# Patient Record
Sex: Male | Born: 1950 | Race: White | Hispanic: No | Marital: Single | Smoking: Current every day smoker
Health system: Southern US, Community
[De-identification: ages and names within clinical notes are randomized; demographics above are authoritative.]

## PROBLEM LIST (undated history)

## (undated) DIAGNOSIS — N189 Chronic kidney disease, unspecified: Secondary | ICD-10-CM

## (undated) DIAGNOSIS — F329 Major depressive disorder, single episode, unspecified: Secondary | ICD-10-CM

## (undated) DIAGNOSIS — E039 Hypothyroidism, unspecified: Secondary | ICD-10-CM

## (undated) DIAGNOSIS — F32A Depression, unspecified: Secondary | ICD-10-CM

## (undated) DIAGNOSIS — I10 Essential (primary) hypertension: Secondary | ICD-10-CM

## (undated) DIAGNOSIS — F419 Anxiety disorder, unspecified: Secondary | ICD-10-CM

## (undated) DIAGNOSIS — E785 Hyperlipidemia, unspecified: Secondary | ICD-10-CM

## (undated) DIAGNOSIS — G4730 Sleep apnea, unspecified: Secondary | ICD-10-CM

## (undated) DIAGNOSIS — E119 Type 2 diabetes mellitus without complications: Secondary | ICD-10-CM

## (undated) HISTORY — PX: APPENDECTOMY: SHX54

## (undated) HISTORY — PX: EYE SURGERY: SHX253

---
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
## 1999-11-14 ENCOUNTER — Emergency Department (HOSPITAL_COMMUNITY): Admission: EM | Admit: 1999-11-14 | Discharge: 1999-11-14 | Payer: Self-pay | Admitting: Emergency Medicine

## 2003-11-23 ENCOUNTER — Emergency Department (HOSPITAL_COMMUNITY): Admission: EM | Admit: 2003-11-23 | Discharge: 2003-11-24 | Payer: Self-pay | Admitting: Emergency Medicine

## 2003-11-24 ENCOUNTER — Ambulatory Visit: Payer: Self-pay | Admitting: Psychiatry

## 2003-11-24 ENCOUNTER — Inpatient Hospital Stay (HOSPITAL_COMMUNITY): Admission: EM | Admit: 2003-11-24 | Discharge: 2003-11-29 | Payer: Self-pay | Admitting: Psychiatry

## 2003-11-27 ENCOUNTER — Ambulatory Visit (HOSPITAL_COMMUNITY): Admission: RE | Admit: 2003-11-27 | Discharge: 2003-11-27 | Payer: Self-pay | Admitting: Internal Medicine

## 2006-11-30 ENCOUNTER — Inpatient Hospital Stay (HOSPITAL_COMMUNITY): Admission: EM | Admit: 2006-11-30 | Discharge: 2006-12-08 | Payer: Self-pay | Admitting: Emergency Medicine

## 2006-12-01 ENCOUNTER — Ambulatory Visit: Payer: Self-pay | Admitting: Psychiatry

## 2006-12-08 ENCOUNTER — Inpatient Hospital Stay (HOSPITAL_COMMUNITY): Admission: AD | Admit: 2006-12-08 | Discharge: 2006-12-14 | Payer: Self-pay | Admitting: *Deleted

## 2006-12-08 ENCOUNTER — Ambulatory Visit: Payer: Self-pay | Admitting: *Deleted

## 2007-01-01 ENCOUNTER — Inpatient Hospital Stay (HOSPITAL_COMMUNITY): Admission: EM | Admit: 2007-01-01 | Discharge: 2007-01-04 | Payer: Self-pay | Admitting: Emergency Medicine

## 2008-06-24 ENCOUNTER — Emergency Department (HOSPITAL_COMMUNITY): Admission: EM | Admit: 2008-06-24 | Discharge: 2008-06-25 | Payer: Self-pay | Admitting: Emergency Medicine

## 2008-06-24 ENCOUNTER — Emergency Department (HOSPITAL_COMMUNITY): Admission: EM | Admit: 2008-06-24 | Discharge: 2008-06-24 | Payer: Self-pay | Admitting: Emergency Medicine

## 2008-12-08 IMAGING — CR DG ABDOMEN ACUTE W/ 1V CHEST
3 series · 3 of 3 positions shown · non-contrast
Comparison: none

CLINICAL DATA: Chest pain.  
 ACUTE ABDOMINAL SERIES ? 3 VIEW:

[w chest pa]
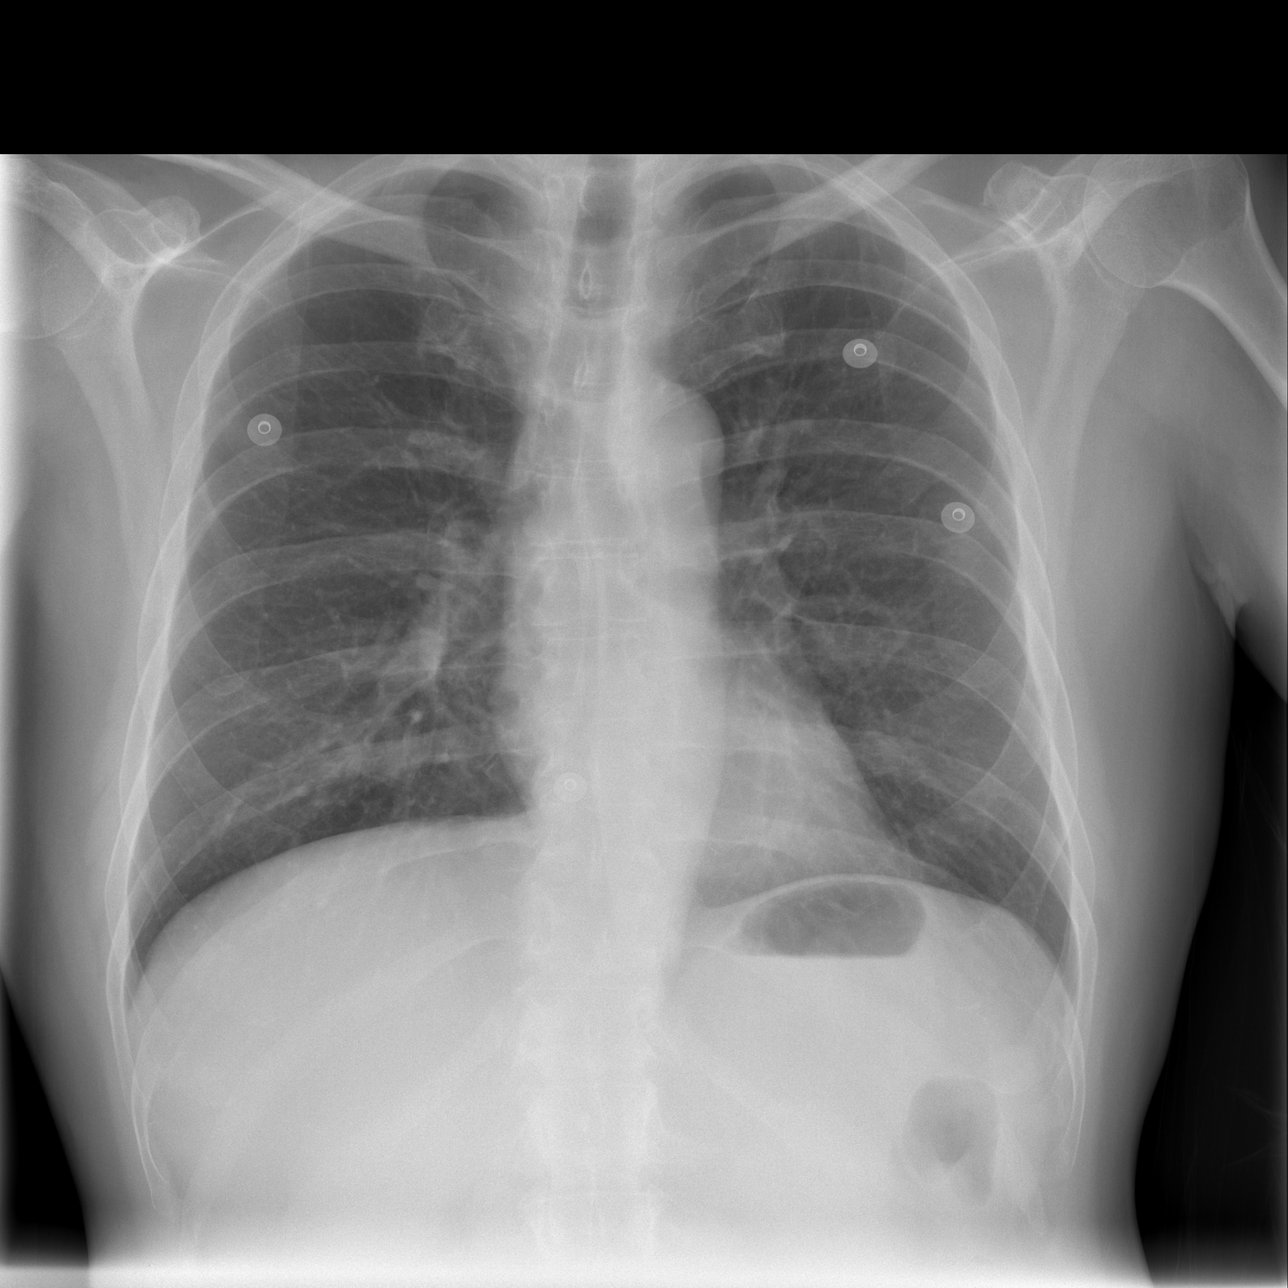

[w abdomen upright *]
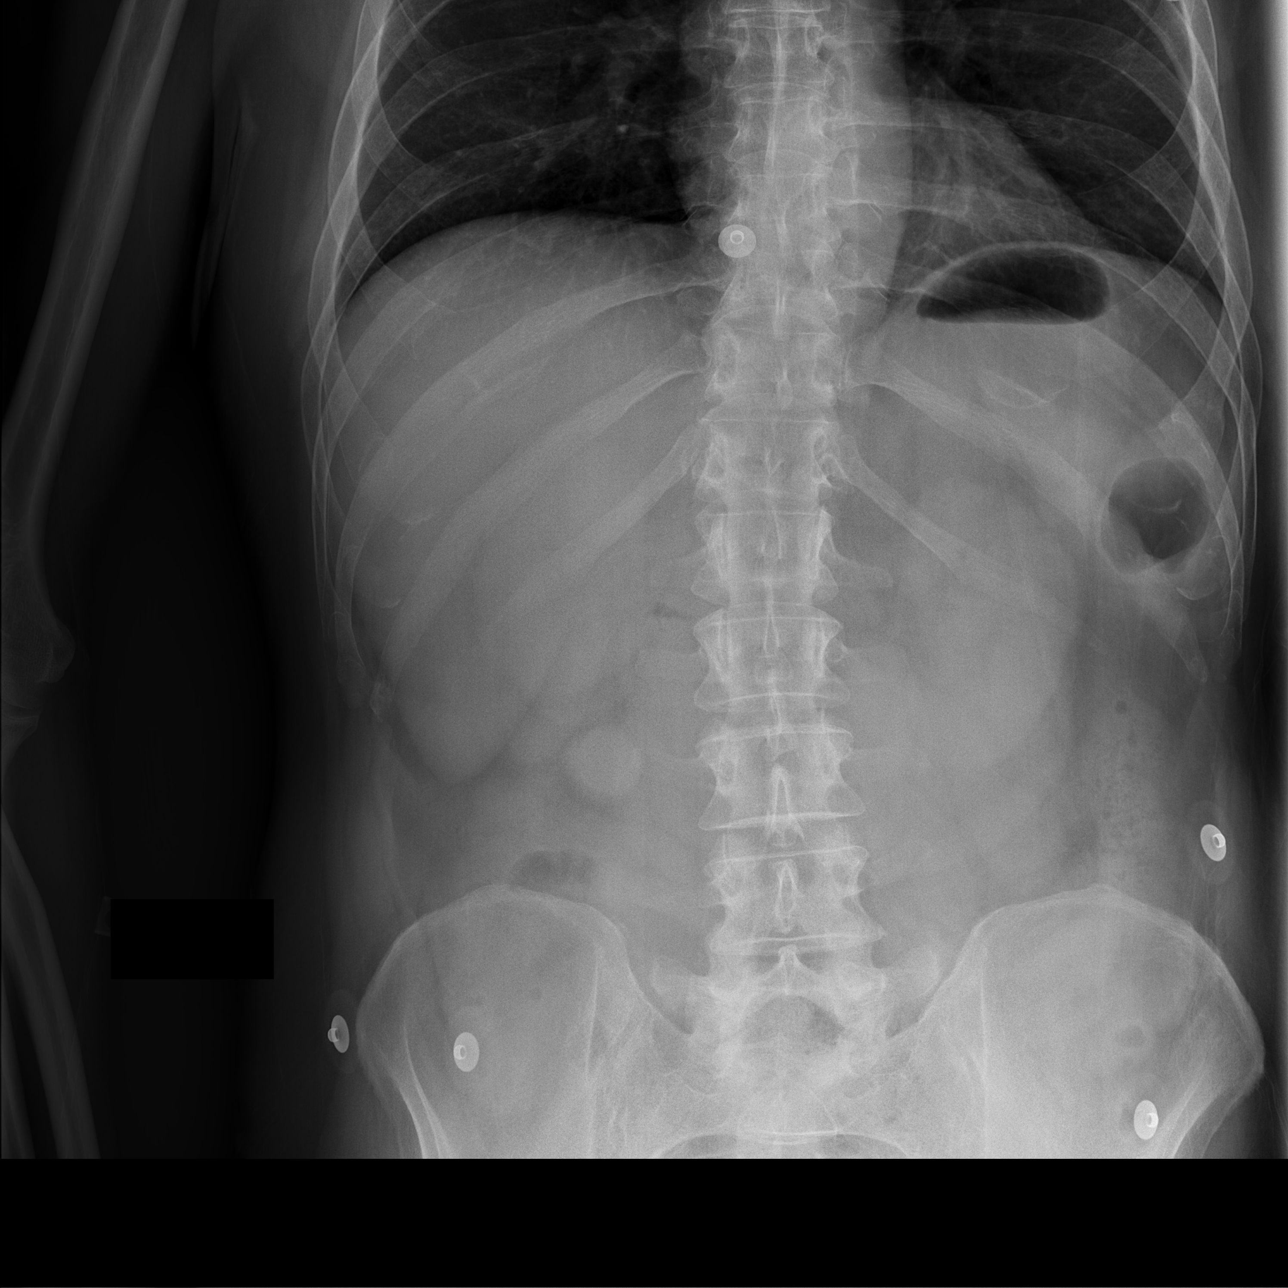

[t abdomen supine]
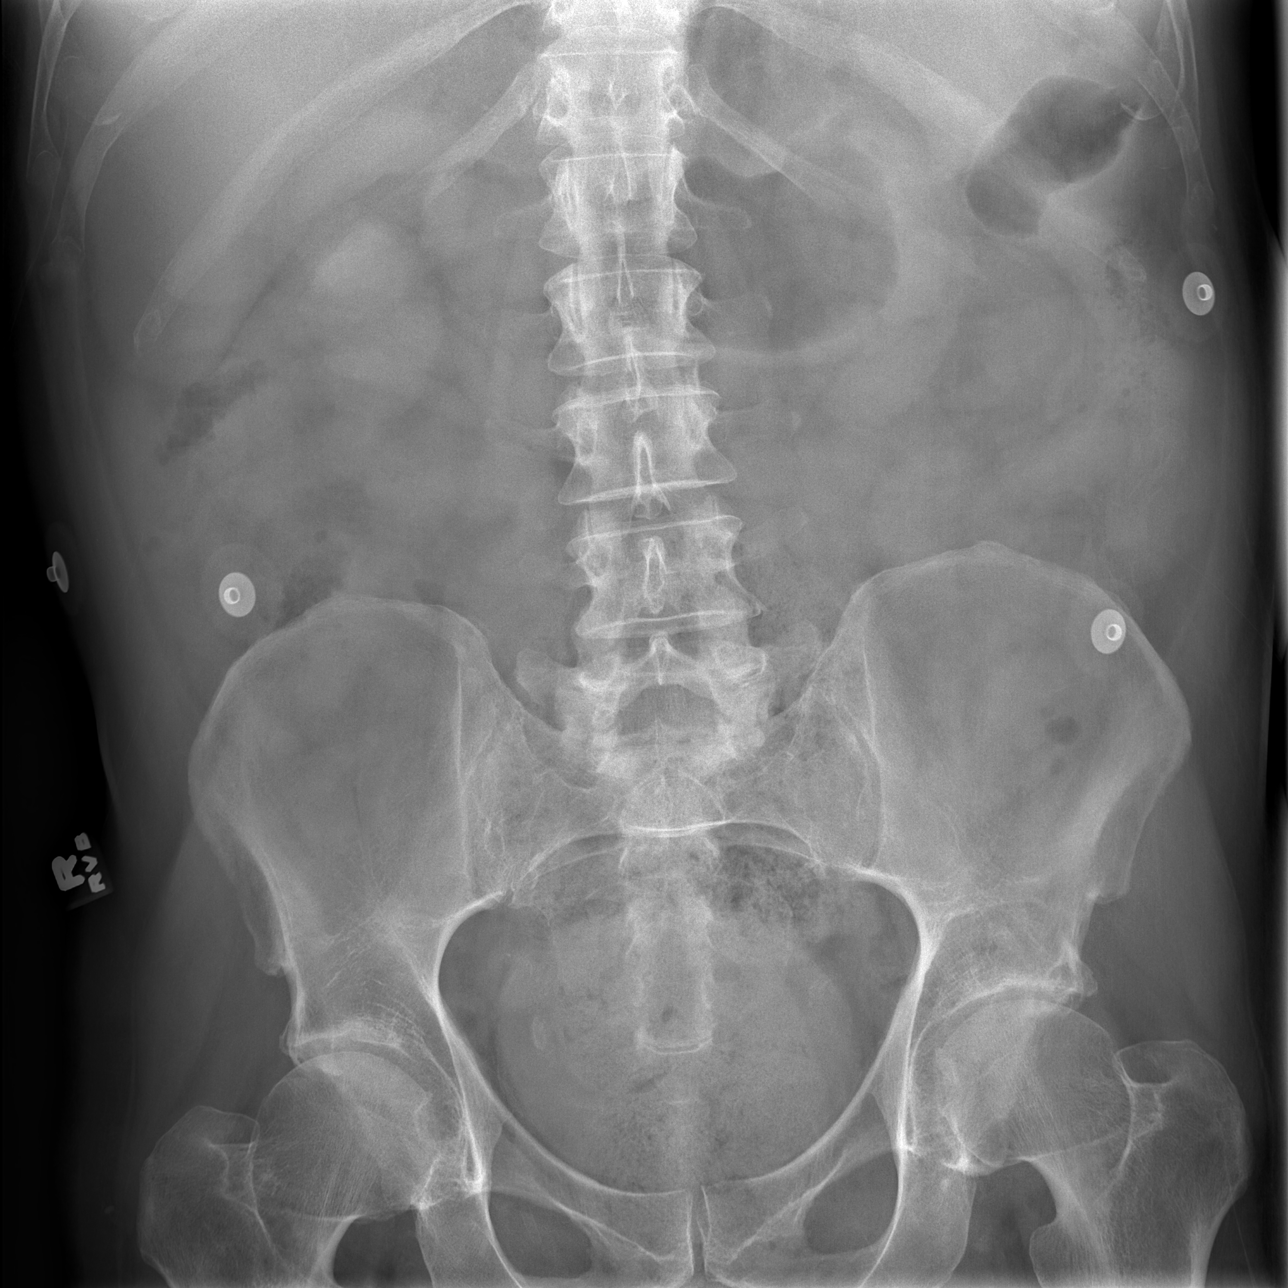

[3 of 3 positions shown; findings below may reference images not displayed]

FINDINGS: Cardiomediastinal silhouette is unremarkable.  The lungs are clear.  The bowel gas pattern is unremarkable.  There is no evidence of bowel obstruction or pneumoperitoneum.  Mild degenerative changes within the lower lumbar spine and hips are present.  There is no evidence of pneumoperitoneum.
IMPRESSION: No acute abnormality.

## 2010-02-26 ENCOUNTER — Encounter: Payer: Self-pay | Admitting: Internal Medicine

## 2010-05-17 LAB — GLUCOSE, CAPILLARY
Glucose-Capillary: 111 mg/dL — ABNORMAL HIGH (ref 70–99)
Glucose-Capillary: 122 mg/dL — ABNORMAL HIGH (ref 70–99)
Glucose-Capillary: 135 mg/dL — ABNORMAL HIGH (ref 70–99)
Glucose-Capillary: 176 mg/dL — ABNORMAL HIGH (ref 70–99)
Glucose-Capillary: 278 mg/dL — ABNORMAL HIGH (ref 70–99)
Glucose-Capillary: 411 mg/dL — ABNORMAL HIGH (ref 70–99)
Glucose-Capillary: 507 mg/dL (ref 70–99)
Glucose-Capillary: 55 mg/dL — ABNORMAL LOW (ref 70–99)

## 2010-05-17 LAB — URINALYSIS, ROUTINE W REFLEX MICROSCOPIC
Bilirubin Urine: NEGATIVE
Glucose, UA: 250 mg/dL — AB
Ketones, ur: NEGATIVE mg/dL
Leukocytes, UA: NEGATIVE
Nitrite: NEGATIVE
Protein, ur: 100 mg/dL — AB
Specific Gravity, Urine: 1.022 (ref 1.005–1.030)
Urobilinogen, UA: 0.2 mg/dL (ref 0.0–1.0)
pH: 5 (ref 5.0–8.0)

## 2010-05-17 LAB — COMPREHENSIVE METABOLIC PANEL
ALT: 17 U/L (ref 0–53)
AST: 21 U/L (ref 0–37)
Albumin: 3 g/dL — ABNORMAL LOW (ref 3.5–5.2)
Alkaline Phosphatase: 66 U/L (ref 39–117)
BUN: 31 mg/dL — ABNORMAL HIGH (ref 6–23)
CO2: 25 mEq/L (ref 19–32)
Calcium: 8.9 mg/dL (ref 8.4–10.5)
Chloride: 107 mEq/L (ref 96–112)
Creatinine, Ser: 1.36 mg/dL (ref 0.4–1.5)
GFR calc Af Amer: >60 mL/min (ref >60)
GFR calc non Af Amer: 54 mL/min — ABNORMAL LOW (ref >60)
Glucose, Bld: 89 mg/dL (ref 70–99)
Potassium: 3.3 mEq/L — ABNORMAL LOW (ref 3.5–5.1)
Sodium: 139 mEq/L (ref 135–145)
Total Bilirubin: 0.3 mg/dL (ref 0.3–1.2)
Total Protein: 6.3 g/dL (ref 6.0–8.3)

## 2010-05-17 LAB — DIFFERENTIAL
Basophils Absolute: 0 10*3/uL (ref 0.0–0.1)
Basophils Relative: 0 % (ref 0–1)
Eosinophils Absolute: 0.1 10*3/uL (ref 0.0–0.7)
Eosinophils Relative: 1 % (ref 0–5)
Lymphocytes Relative: 9 % — ABNORMAL LOW (ref 12–46)
Lymphs Abs: 1.2 10*3/uL (ref 0.7–4.0)
Monocytes Absolute: 0.6 10*3/uL (ref 0.1–1.0)
Monocytes Relative: 4 % (ref 3–12)
Neutro Abs: 11.7 10*3/uL — ABNORMAL HIGH (ref 1.7–7.7)
Neutrophils Relative %: 85 % — ABNORMAL HIGH (ref 43–77)

## 2010-05-17 LAB — BASIC METABOLIC PANEL
BUN: 39 mg/dL — ABNORMAL HIGH (ref 6–23)
CO2: 23 mEq/L (ref 19–32)
Calcium: 10.1 mg/dL (ref 8.4–10.5)
Chloride: 101 mEq/L (ref 96–112)
Creatinine, Ser: 1.45 mg/dL (ref 0.4–1.5)
GFR calc Af Amer: >60 mL/min (ref >60)
GFR calc non Af Amer: 50 mL/min — ABNORMAL LOW (ref >60)
Glucose, Bld: 444 mg/dL — ABNORMAL HIGH (ref 70–99)
Potassium: 5 mEq/L (ref 3.5–5.1)
Sodium: 136 mEq/L (ref 135–145)

## 2010-05-17 LAB — CBC
HCT: 38.4 % — ABNORMAL LOW (ref 39.0–52.0)
Hemoglobin: 13.1 g/dL (ref 13.0–17.0)
MCHC: 34.2 g/dL (ref 30.0–36.0)
MCV: 88.1 fL (ref 78.0–100.0)
Platelets: 262 10*3/uL (ref 150–400)
RBC: 4.35 MIL/uL (ref 4.22–5.81)
RDW: 13.8 % (ref 11.5–15.5)
WBC: 13.7 10*3/uL — ABNORMAL HIGH (ref 4.0–10.5)

## 2010-05-17 LAB — URINE MICROSCOPIC-ADD ON

## 2010-06-10 ENCOUNTER — Other Ambulatory Visit: Payer: Self-pay | Admitting: Family Medicine

## 2010-06-10 DIAGNOSIS — S92909A Unspecified fracture of unspecified foot, initial encounter for closed fracture: Secondary | ICD-10-CM

## 2010-06-21 NOTE — Consult Note (Signed)
NAME:  Johnny [name], Johnny [name] NO.:  [id number]   MEDICAL RECORD NO.:  [id number]          PATIENT TYPE:  EMS   LOCATION:  [facility]                         FACILITY:  [facility]   PHYSICIAN:  Blima Rich, MD      DATE OF BIRTH:  12-Sep-1950   DATE OF CONSULTATION:  06/25/2008  DATE OF DISCHARGE:  06/24/2008                                 CONSULTATION   REASON FOR CONSULTATION:  Hypoglycemia.   HISTORY OF PRESENT ILLNESS:  Johnny [name] is a pleasant 60 year old  Caucasian male who had presented to the ER earlier this morning with  complaints of hypoglycemia and claiming that he had stopped taking all  his medications for over approximately one month, secondary to him not  wanting to live anymore.  They evaluated him in the ER and found that  his blood sugar was approximately 444.  They gave him 10 units of  regular insulin, fed him and sent the patient home.   At home, the patient's blood sugar was 197.  He claims that he has been  prescribed 50 units of regular insulin to take before meals, if his  blood sugar is in that range.  He took 50 units and then became  hypoglycemic and lethargic, and was brought to the emergency department.   At the time of initial presentation, his blood sugar was 60 and blood  pressure 80/60.  The patient is adamant and he denies that this was not  a suicide attempt.  The patient received D50 as well as was fed in the  ER.  Now his blood sugars are  persistently in the 120-130s over the  past 3 hours.  He is alert, oriented and stable, and able to converse  appropriately with me.  He does complain of a mild headache at this  time.  No blurring of vision.  No tinnitus.  No syncope.  No presyncope.  No lightheadedness.  He did have nausea and vomiting earlier when his  blood sugar was low.  Currently no complaints.  No abdominal pain,  diarrhea or constipation; dysuria, polyuria or hematuria.  No chest  pain, palpitations, PND, orthopnea; and no  shortness of breath, cough or  expectoration.   PAST MEDICAL/SURGICAL HISTORY:  1. Hypertension.  2. Diabetes.  3. Depression.   SOCIAL HISTORY:  Occasional alcohol drinker.  Smokes a pipe.  Denies  illicits.   FAMILY HISTORY:  Mother is alive, has mild dementia and hypertension.  Father __________; patient cannot recall.   ALLERGIES:  The patient claims that he has no known drug allergies.   HOME MEDICATIONS:  1. Aspirin 81 mg p.o. daily.  2. Zocor 40 mg p.o. q.h.s.  3. Hydrochlorothiazide 25 mg daily.  4. Lisinopril 20 mg daily.  5. Effexor SR 225 mg at bedtime.  6. [place] __________.  7. Vitamin D one tablet daily.   REVIEW OF SYSTEMS:  This actually 14-point review of systems has been  performed.  Pertinent positives and negatives as described above.   PHYSICAL EXAMINATION:  VITALS:  (At time of presentation)  Temperature  97.9, heart  rate 90, respiratory rate 15, blood pressure 117/76, O2  saturations 99% on room air.  GENERAL:  A well nourished, well developed Caucasian male, lying in bed  comfortably.  No apparent distress; conversing with his family members.  HEENT:  Normocephalic and atraumatic.  Moist oral mucosa.  No thrush,  erythema or postnasal drip.  Eyes anicteric.  Extraocular muscles were  intact.  Pupils are equal and reactive to light and accommodation.  CARDIOVASCULAR:  S1 and S2 normal.  Regular rate and rhythm.  No  murmurs, rubs or gallops.  RS:  Air entry is bilaterally equal.  No rales, rhonchi or wheezes  appreciated.  ABDOMEN:  Soft, nontender, nondistended.  Positive bowel sounds.  No  organomegaly.  EXTREMITIES:  No cyanosis, clubbing or edema.  Positive bilateral  dorsalis pedis.  CNS:  Alert, oriented x3.  Cranial nerves II-XII grossly intact.  Power,  sensation and reflexes bilaterally symmetrical.  SKIN:  No breakdown, swelling, ulcerations or masses.  HEMATOLOGY/ONCOLOGY:  No palpable lymphadenopathy, ecchymosis, bruising  or  petechia.  NECK:  Supple, good range of motion.  No thyromegaly, no carotid bruit.  Neck veins appear to be within normal limits.   LABORATORY TESTS:  CMP:  Sodium 139, potassium 2.3, chloride 107, CO2  25, glucose 89, BUN 31, creatinine 1.36.  CBC:  White blood cell count  13.7, hemoglobin 13.1, hematocrit 38.4, platelet count 262, polymorphs  85.   DIAGNOSTIC TESTS:  Chest one view demonstrates no active ongoing  disease.  No cardiomegaly.  No cardiopulmonary disease.   ASSESSMENT AND PLAN:  Hypoglycemia:  Currently resolved.  This is  secondary to administration of high dose of regular insulin (50 units  with a blood sugar of 197).  Of note, apparently the patient received  only 10 units of regular insulin for his blood sugar of 444 in the  emergency department earlier in the day.  I have had an extensive  discussion, both with the patient and his 2 sisters who are at his  bedside.  I have asked the patient repeatedly what his home doses of  insulin are.  He claims that he has it written down at home on his  insulin card at home, but he cannot recall it off the top of his head.   Based on his current presentation, I have recommended that he take  approximately 10 units of Lantus at bedtime, and not take more than 12  units of regular insulin before a heavy meal.  If he is to have a light  or normal meal, to only take 6 units.  I have expressed this with both  the patient as well as his sisters who are at the bedside.  I have also  instructed all of them that he is to call his primary MD in the morning,  and to obtain his home doses of Lantus and regular insulin.  The patient  has expressed understanding as well as have the sisters.  They claim  that they will follow up as instructed.   Also of note, I have asked [facility] to see the patient in the  emergency department, and make an assessment prior to the patient being  discharged.  I have spoken to them personally and  they will see the  patient while he is in the emergency department.   He is clear from a medical standpoint and I feel that he can be  discharged to  home, if [facility] does not feel  any other  therapies or interventions are warranted at this time.      Blima Rich, MD  Electronically Signed     SP/MEDQ  D:  06/25/2008  T:  06/25/2008  Job:  [id number]

## 2010-06-21 NOTE — Consult Note (Signed)
NAMERANDOM, TOUSANT NO.:  [id number]   MEDICAL RECORD NO.:  [id number]          PATIENT TYPE:  INP   LOCATION:  [place]                         FACILITY:  [facility]   PHYSICIAN:  Felizardo Hoffmann, M.D.  DATE OF BIRTH:  08-03-1950   DATE OF CONSULTATION:  11/30/2006  DATE OF DISCHARGE:                                 CONSULTATION   REQUESTING PHYSICIAN:  [facility].   REASON FOR CONSULTATION:  Depression, suicide attempt, of alcoholism.   HISTORY OF PRESENT ILLNESS:  [name] is a 60 year old male  currently in the [facility] emergency room awaiting admission to the  [facility].   [name] overdosed on medication last night.  He acknowledges that he  had been having suicidal thoughts.  He has depressed mood, hopelessness,  low energy, difficulty concentrating.  He is not having any  hallucinations or delusions.  He is a partial historian.   PAST PSYCHIATRIC HISTORY:  [name] was admitted to the [facility] in 2005 for major depression with suicidal  ideation.  He was discharged on Lexapro 15 mg q.a.m.   Most recently, the patient is taking Effexor 75 mg b.i.d.   FAMILY PSYCHIATRIC HISTORY:  None known.   SOCIAL HISTORY:  The patient has been living with his mother.  He has a  supportive sister who lives in the area.  He has a history of drinking  alcohol.  He does not use any illegal drugs.  He used to be a Dietitian.   PAST MEDICAL HISTORY:  Diabetes with glucose on admission above 600,  rule out delirium.   MEDICATIONS:  MAR is reviewed.  The patient is on the following  psychotropic medications:  1. Xanax 0.5 mg b.i.d.  2. Effexor 75 mg b.i.d.  3. Ativan 2 mg q.6h. p.r.n.  4. Ambien 5 mg q.h.s. p.r.n..   LABORATORY DATA:  SGOT is 15, SGPT is 20.  WBC 6.5, hemoglobin 15.3,  platelet count 251.   REVIEW OF SYSTEMS:  Noncontributory.   PHYSICAL EXAMINATION:  [name] is a middle-aged male lying  in a  supine position on his emergency room gurney.  He is alert.  His eye  contact is intermittent.  He has constricted affect.  His mood is  depressed.  On orientation testing, he knows the year and the month as  well as the day of the month.  He does know place.  Memory testing:  2/3  immediate, 2/3 at 5 minutes.  Fund of knowledge and intelligence grossly  within normal limits.  Speech is without dysarthria.  There is a flat  prosody and a poverty of speech.  Thought process:  There is slight  difficulty with word finding.  Overall coherent thought content.  He  does have thoughts of hopelessness and helplessness as well as suicidal  ideation.  He does not consider his life worth living.  His  concentration is decreased.  Judgment is impaired.  Insight is partial.   On hand extension, he has no tremor.  He has no  sweats.  Please see the  emergency room report for vital signs.   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   History of alcohol abuse versus dependence.   Rule out 293.00, delirium, not otherwise specified, which appears to be  improving.   AXIS II:  Deferred.   AXIS III:  See general medical section.   AXIS IV:  Primary support group, general medical.   AXIS V:  30.   [name] is still at risk to harm himself.  He also has impaired  judgment.   RECOMMENDATIONS:  1. Would continue a sitter while he is in the general medical      environment.  2. Once medically cleared, would admit to a psychiatric hospital for      dual diagnosis evaluation and treatment.  3. Would continue to monitor for alcohol withdrawal.  It is unclear      how much alcohol he has been using.  His transaminases do not show      any indication of acute alcohol insult.  Would have Ativan      available and start the Ativan withdrawal protocol if alcohol      withdrawal presents.  4. Would continue his thiamine 100 mg daily, multivitamin daily,  folic      acid 1 mg daily.  5. 12-step method if the patient regains his cognitive capability.  6. Would continue his Effexor 75 mg b.i.d.      Felizardo Hoffmann, M.D.  Electronically Signed     JW/MEDQ  D:  11/30/2006  T:  12/02/2006  Job:  [id number]

## 2010-06-21 NOTE — H&P (Signed)
Johnny [name], SIRCY NO.:  [id number]   MEDICAL RECORD NO.:  [id number]          PATIENT TYPE:  INP   LOCATION:  1232                         FACILITY:  [facility]   PHYSICIAN:  Jana Hakim, M.D. DATE OF BIRTH:  [birth year]   DATE OF ADMISSION:  12/31/2006  DATE OF DISCHARGE:                              HISTORY & PHYSICAL   PRIMARY CARE PHYSICIAN:  Unassigned.   CHIEF COMPLAINT:  Overdose.   HISTORY OF PRESENT ILLNESS:  This is a 60 year old male who was sent to  the emergency department emergently after being found confused and  taking an increased amount of over-the-counter Benadryl.  The patient's  sister called emergency medical services and had the patient transported  to the hospital.  The patient is unable to give the history and his  sister at bedside reports he had a similar hospitalization on October  24, was admitted, and then was transferred to [facility]  once he was stabilized.  The patient has a history of severe depression.   PAST MEDICAL HISTORY:  1. Type 2 diabetes.  2. Hyperlipidemia.  3. Hypertension.   MEDICATIONS:  His the patient's sister reports that patient has  medications, however does not take them regularly.  He has:  1. Glipizide 10 mg b.i.d.  2. Simvastatin 40 mg one p.o. q.h.s.   ALLERGIES:  NO KNOWN DRUG ALLERGIES.   SOCIAL HISTORY:  Lives with his mother.  He is disabled, unemployed.  Positive history of tobacco, smokes four cigarettes per day.  Unknown  history of alcohol.   FAMILY HISTORY:  Noncontributory.  However, the patient does have a  family history of depression and bipolar disorder.   REVIEW OF SYSTEMS:  The patient has had no nausea, vomiting, diarrhea,  chest pain, shortness of breath.  His blood sugars have been elevated.  In the emergency department, his blood sugars were found to be in the  500s.   PHYSICAL EXAMINATION:  GENERAL:  This is a 60 year old thin, well-  developed male in  discomfort but no acute distress.  VITAL SIGNS:  On initial evaluation, temperature 96.4, blood pressure  192/114, heart rate 108, respirations 20, O2 saturations 100%.  HEENT: Normocephalic, atraumatic.  Pupils are sluggish but reactive.  Extraocular muscles are intact.  Funduscopic benign.  Oropharynx is  clear.  Mucosa dry.  No exudates.  NECK: Supple full range of motion.  No thyromegaly, adenopathy, jugular  venous distention.  CARDIOVASCULAR:  Tachycardiac rate and rhythm.  No murmurs, gallops or  rubs.  LUNGS: Clear to auscultation bilaterally.  ABDOMEN:  Positive bowel sounds, soft, nontender, nondistended.  EXTREMITIES: Without cyanosis, clubbing or edema.  NEUROLOGIC:  The patient is confused.  His speech is slurred.  Cranial  nerves are intact.  There is no facial drooping or tongue deviation.  Motor and sensory function are intact.  He is able to move all four of  his extremities.   LABORATORY STUDIES:  White blood cell count 7.9, hemoglobin 14.8,  hematocrit 42.8, MCV 88.1, platelets 350, neutrophils 70% lymphocytes  22%, glucose level 468.   PLAN:  The patient will be admitted to an ICU area for cardiac and  pulmonary monitoring.  An IV insulin drip has been ordered per the  insulin protocol.  IV fluids have also been ordered.  The patient will  be placed on clear liquids for now.  He will be monitored for cardiac  changes and cardiac enzymes will be performed.  The urine drug screen is  pending as well as the Tylenol level.  Pending results of these, further  treatments may be rendered.  A one-to-one sitter has been ordered along  with suicide precautions, and DVT and GI prophylaxis and antiemetics  have been ordered.  Psychiatry will be consulted.      Jana Hakim, M.D.  Electronically Signed     HJ/MEDQ  D:  01/01/2007  T:  01/02/2007  Job:  [id number]

## 2010-06-21 NOTE — Discharge Summary (Signed)
NAMEDAKSH, STOCKLEY NO.:  [id number]   MEDICAL RECORD NO.:  [id number]          PATIENT TYPE:  INP   LOCATION:  1503                         FACILITY:  [facility]   PHYSICIAN:  Aquilla Hacker, M.D. DATE OF BIRTH:  1950/12/05   DATE OF ADMISSION:  11/30/2006  DATE OF DISCHARGE:  12/08/2006                               DISCHARGE SUMMARY   FINAL DIAGNOSES:  1. Alcohol dependence.  2. Major depressive disorder, recurrent and severe.  3. Mild transaminitis.  4. Diabetes mellitus with uncontrolled sugars.  5. Acute renal failure.  6. Hyperlipidemia.   CONSULTATIONS:  Psychiatry, Felizardo Hoffmann, M.D.   PROCEDURES:  1. Acute abdominal x-ray completed November 30, 2006.  2. Renal ultrasound completed December 01, 2006.   PRIMARY CARE PHYSICIAN:  Unassigned.   HISTORY OF PRESENT ILLNESS:  Mr. Everhart is a 60 year old gentleman who  was brought to the hospital by the family.  They indicated that the  patient had not been eating or taking his medications for at least a  month.  He had taken an unknown number of sleeping pills in an attempt  to commit suicide.  At the time of his admission, he complained of some  nausea, some burning chest discomfort.   PAST MEDICAL HISTORY:  Please see that dictated by Dr. Marilynne Drivers.   HOSPITAL COURSE:  #1.  MAJOR DEPRESSION WITH SUICIDE ATTEMPT:  Dr. Rhona Raider,  psychiatrist, was consulted.  A one-to-one sitter was provided.  The  patient was started on Effexor, and the dose has been titrated  throughout the patient's hospital stay.  Psychiatry has continued to  follow the patient.  The ultimate goal is for the patient to be  transferred to a psychiatric facility for further aggressive treatment.   #2.  ALCOHOL DEPENDENCE:  The patient was started on thiamine,  multivitamins, and folic acid.  He did not display any documented signs  of withdrawal during the course of his hospitalization.   #3.  ACUTE RENAL  INSUFFICIENCY:  At the time of the patient's admission,  his BUN was noted to have been 24 with a creatinine of 1.78.  Whether or  not this was attributed to dehydration is questionable.  However, over  the course of the patient's hospitalization, his BUN improved to 11 with  a creatinine of 1.11 by December 06, 2006.   #4.  ELEVATED LIVER ENZYMES OR MILD HEPATITIS:  The patient's SGOT was  noted to be 15, SGPT 20, alkaline phosphatase 66 on November 30, 2006.  By October 26, the patient's alkaline phosphatase decreased to 47, SGOT  to 171, SGPT to 133. The etiology of this is questionable.  There was  concern that the patient's statin  he had been on versus alcohol may  have contributed to these numbers.  The patient did not complain of any  abdominal pain throughout the course of his hospitalization.  Hepatitis  B surface antigen was found to be negative, and hepatitis C antibody was  found to be negative.  Liver enzymes have been monitored over the course  of hospitalization, and they have  normalized as of October 30 with a  bilirubin of 0.6, alkaline phosphatase 46, SGOT 33, and SGPT of 65.  The  patient's Zocor has subsequently been discontinued.   #5.  UNCONTROLLED DIABETES MELLITUS:  At the time of admission, the  patient's serum glucose was found to be 644.  He had an acetone of 92  with the glucose being greater than 1000 in his urine.  This was most  likely secondary to the patient's noncompliance with his medications  prior to this admission.  The patient was started on a regimen of Lantus  insulin as well as glipizide.  His sugars have improved, however, they  are not completely normal.  A hemoglobin A1c was completed on October  24.  It revealed a hemoglobin A1c of 15.8, indicating that the patient  more than likely has been noncompliant with his diabetic medications for  at least 3 months if not longer.  Again, the patient's sugars are  better; however, they are not ideally  controlled.  I suspect that once  the patient is discharged from the hospital, other adjustments to his  medications can take place.   #6.  HYPERLIPIDEMIA:  A fasting lipid profile was completed October 24.  The cholesterol level was 246, triglycerides 164, with LDL of 175.  Again, the patient was on a statin  previously; however, this was  discontinued due to the elevated liver enzymes.  I suspect that in the  near future, the patient's primary care physician may consider  restarting him on medications for treatment of his hyperlipidemia.   CONDITION ON DISCHARGE:  Currently, the patient has been without any  distress over the past 3-4 days.  He looks comfortable and has been  cooperative.  His vital signs today show temperature 98.4, heart rate  78, respirations 20, blood pressure 132/83.  O2 saturation 97% on room  air.  Decision has been made to discharge the patient from the hospital  to [facility].   DISCHARGE MEDICATIONS:  1. Xanax 0.5 mg p.o. b.i.d.  2. Norvasc 10 mg p.o. daily.  3. Aspirin 81 mg p.o. daily.  4. Folic acid 1 mg p.o. daily.  5. Glipizide 10 mg p.o. b.i.d.  6. Lantus insulin 31 units nightly.  7. Multivitamins 1 tablet p.o. daily.  8. Nicotine patch 40 mg q. 14 h.  9. Protonix 40 mg daily.  10.Senokot S 1 tablet p.o. daily.  11.Thiamine 100 mg p.o. daily.  12.Effexor 225 mg p.o. daily.  13.Albuterol MDI 2 puffs q. 6 h p.r.n.  14.Ambien 5 mg p.o. nightly p.r.n.      Aquilla Hacker, M.D.  Electronically Signed     OR/MEDQ  D:  12/08/2006  T:  12/08/2006  Job:  [id number]

## 2010-06-21 NOTE — H&P (Signed)
NAME:  Johnny [name], Johnny [name] NO.:  [id number]   MEDICAL RECORD NO.:  [id number]          PATIENT TYPE:  EMS   LOCATION:  ED                           FACILITY[facility]  [facility]   PHYSICIAN:  Durwin Nora, MDDATE OF BIRTH:  [birth year]   DATE OF ADMISSION:  11/30/2006  DATE OF DISCHARGE:                              HISTORY & PHYSICAL   PRESENTING COMPLAINT:  Elevated blood sugar, poor oral intake for 1  month, suicidal ideation, several attempts x2 days.   HISTORY OF PRESENTING COMPLAINT:  This is a 60 year old male who was  brought to the emergency room today by family as he had not been eating  or taking his medication deliberately for the last 1 month.  He admits  to having consumed an unknown number of sleeping pills that he bought  over-the-counter yesterday in a suicide attempt.  Patient also gives a  history of nausea, retrosternal burning chest pain, no palpitations, no  shortness of breath. He has bouts of vomiting, no diarrhea, no  constipation.  No jaundice.  He has no appetite.  He has insomnia.  He  has anhedonia. He denies any fever, headaches.  The last time patient  saw a psychiatrist was in the spring, 2008 and he has not been taking  his medication for depression.  He thinks it possibly was Effexor but is  not sure.   PAST MEDICAL HISTORY:  1. Hypertension.  2. Diabetes.  3. Hyperlipidemia.  4. Depression.   FAMILY HISTORY:  Two aunts have psychotic disease, bipolar disorder and  schizophrenia.   PAST SURGICAL HISTORY:  Appendectomy.   SOCIAL HISTORY:  The patient smokes a pipe, about an ounce of tobacco  every day.  He drinks about 4 bottles of beer daily. He denies use of  illicit drugs. Patient is divorced, has no children.   REVIEW OF SYSTEMS:  Systems reviewed, pertinent for as above.   PHYSICAL EXAMINATION:  GENERAL:  He is a middle-aged man not in acute  respiratory distress.  He has a flat affect.  HEENT:  Head is atraumatic,  normocephalic, pupils are equal, round and  reactive to light and accommodation. Extraocular movements are intact.  Mucous membranes are moist.  NECK:  Supple.  CHEST:  Is clinically clear.  CARDIAC:  Heart sounds 1 and 2 regular.  ABDOMEN:  Soft, mildly distended, nontender, no organomegaly.  EXTREMITIES:  Peripheral pulses are present, no pedal edema.  VITAL SIGNS:  Temperature is 98, pulse is 88, respiratory rate is 18,  blood pressure 154/94, saturating 96% on room air.   MEDICATIONS:  1. Glipizide 10 mg b.i.d.  2. Cialis 20 mg p.r.n.  3. Cymbalta 40 mg daily.   ALLERGIES:  No known drug allergies.   LAB STUDIES:  WBC 6.5 thousand, hematocrit 44, platelet count is  252,000.  Sodium is 135, potassium 5.1, chloride 97, bicarbonate 27,  glucose 644, BUN 24, creatinine 1.78.  AST is 15, ALT is 20, alkaline  phosphatase is 66, total bilirubin 1.4, lipase is 28, acetone is 92,  total protein less than 0.05, CK-MB less than  1.  Urinalysis:  glucose  is greater than 1000.   EKG shows normal sinus rhythm at 90 per minute.   ASSESSMENT:  1. Uncontrolled diabetes.  2. Major depression, suicide attempt, suicidal ideation.  3. Hyperlipidemia.  4. Renal insufficiency.  5. Chest pain, rule out secondary to gastroesophageal reflux disease.  6. Gastroparesis.   The patient is being admitted to a telemetry bed, started on IV fluid  normal saline 75 mL/Hr. Glucomander protocol after which he will be  switched to Lantus Insulin 50 units SQ q.h.s. and NovoLog 4 units t.i.d.  with meals, continue Glipizide 10 mg daily, start him of Effexor 75 mg  b.i.d., get a psychiatric consult.  Suicide watch.  Transfer to be a [place]  inpatient once medically stable.  Diet to be a 1800 calorie, low  cholesterol, ADA diet.  Will follow his calcium and phosphate, thyroid function tests, lipids,  cardiac enzymes and EKG.  Lovenox 30 mg SQ daily. Phenergan 12.5 mg IV  q.8 hours p.r.n. and O2 2 liters p.r.n.   Sliding scale insulin per  protocol.  Start amlodipine 10 mg daily, lopressor 2.5 mg IV q.6 hours  p.r.n. for blood pressure greater than 164/110.  The patient has no  health care [place] and is a full code for now.      Durwin Nora, MD  Electronically Signed     MIO/MEDQ  D:  11/30/2006  T:  12/01/2006  Job:  [id number]

## 2010-06-21 NOTE — Discharge Summary (Signed)
NAMEDEMBA, HULLENDER NO.:  [id number]   MEDICAL RECORD NO.:  [id number]          PATIENT TYPE:  INP   LOCATION:  [id number]                         FACILITY:  [facility]   PHYSICIAN:  Durwin Nora, MDDATE OF BIRTH:  1950-11-23   DATE OF ADMISSION:  12/31/2006  DATE OF DISCHARGE:  01/04/2007                               DISCHARGE SUMMARY   DISCHARGE DIAGNOSES:  1. Intentional drug overdose, suicide attempt.  2. Diabetes.  3. Hypertension, fair control.  4. Urinary tract infection, improved.  5. Hypokalemia, resolved.  6. Protein calorie malnutrition.  7. Hyperlipidemia.  8. History of previous suicide attempt.  9. Major depression.   CONSULTATIONS:  Dr. Felizardo Hoffmann, psychiatry.   HOSPITAL COURSE:  This 60 year old patient now presented to the  emergency room after taking unknown amounts of Benadryl and Tylenol PM.  Has past medical history of severe depression, had previously been  admitted on 11/30/2006 for suicide attempt that was treated at the  behavior center.  She was admitted to the intensive care unit, started  on IV insulin drip, placed on suicide watch with 1 on 1 sitter.  He had  severe hypokalemia which was repleted.  His urinalysis did show he has a  urinary tract infection and urine culture was sent, result is still  pending.  He was started on nitrofurantoin.  Please note that the urine  culture did come back positive for staph coagulase sensitive to Levaquin  and nitrofurantoin.  He was started on nitrofurantoin on 01/03/2007.  At  present, mood is stabilizing.  He was initially voluntarily committed on  admission but now the patient is voluntarily wanting to seek inpatient  psychiatric help as suggested by the psychiatrists.  Possibilities are  history of alcohol abuse and acute renal insufficiency which is  resolved.  See diagnoses. In addition, the patient was tachycardic on  presentation but this is all resolved.  Glucose on admission  was 401.   DISCHARGE CONDITION:  Stable.   DIET:  1800 calorie ADA, heart healthy, low cholesterol.   ACTIVITY:  Increase as tolerated.   FOLLOW UP:  Followup with the psychiatrist at [facility].  He is  also to be attended from the primary care physician at the [facility] for his medical problems.   MEDICATIONS ON DISCHARGE:  1. Protonix 40 mg p.o. daily.  2. Insulin 20 units subcutaneous every h.s.  3. Nitrofurantoin 100 mg b.i.d. for 3 more days.  4. Norvasc 10 mg daily.  5. Librium 25 mg b.i.d.  6. Effexor 75 mg t.i.d.  7. Reminyl 15 mg every h.s.  8. Phenergan 12.5 mg p.o. every 8 hours as needed for nausea.  9. Clonidine 0.12 mg every 8 hours p.r.n. for blood pressure greater      than 160/110.   PHYSICAL EXAMINATION:  GENERAL:  no acute distress.  VITAL SIGNS:  Temperature 97, pulse 66, respiratory rate 18, blood  pressure 119/68.  HEENT:  Pupils equal, round and reactive to light and accommodation.  NECK:  Supple. Mucous membranes are moist.  Oropharynx and nasopharynx  is clear.  HEAD:  Atraumatic, normocephalic.  No elevated JVD or submandibular  leaks.  No thyromegaly.  No carotid bruits.  CHEST:  Clinically clear.  HEART:  Sounds 1 and 2 regular rate and rhythm.  ABDOMEN:  Benign, soft, nontender, no organomegaly.  Bowel sounds normal  active.  NEUROLOGIC:  Cranial nerves are patent.  He is alert, oriented x 3.  No  suicidal or homicidal ideation.  Memory intact.  He has insight.  EXTREMITIES:  Peripheral pulses present.  No pedal edema.   RECOMMENDATIONS:  1. Increase his Lantus insulin to 18 units subcu every h.s.  He is to      be on Accu-check a.c., h.s. with moderate sliding scale coverage.      The Lantus insulin is to be increased to 16 units subcu every h.s.,      __________ 16 units subcu every h.s.  previous medication.   LABORATORY:  Glucose 148 ranging to 230.  The urine culture staph  coagulase chemistry today sodium 141,  potassium 2.6, chloride 115,  bicarbonate 23, BUN 7, creatinine 1.3.  hemoglobin A1c 12.3.      Durwin Nora, MD  Electronically Signed     MIO/MEDQ  D:  01/04/2007  T:  01/04/2007  Job:  [id number]

## 2010-06-21 NOTE — H&P (Signed)
Johnny [name], Johnny [name] NO.:  [id number]   MEDICAL RECORD NO.:  [id number]          PATIENT TYPE:  IPS   LOCATION:  0601                          FACILITY:  [facility]   PHYSICIAN:  Stark Jock, M.D. DATE OF BIRTH:  1950-08-31   DATE OF ADMISSION:  12/08/2006  DATE OF DISCHARGE:                       PSYCHIATRIC ADMISSION ASSESSMENT   This is a voluntary admission to the services of Dr. Randye Lobo.   IDENTIFYING INFORMATION:  This is a 60 year old divorced white male.  Apparently, he has become despondent.  He has been unemployed for at  least a year.  He had recently become deliberately not taking his  medications and on October 24th he just could not resist suicidal  ideation anymore and he went out and bought over-the-counter Tylenol PM  and overdosed.  He was admitted to the main hospital 10/24 to 11/01.  He  was treated for his major depression with intentional overdose on  Tylenol PM.  He was treated for his alcohol dependence for acute renal  insufficiency.  His elevated liver enzymes or mild hepatitis and  uncontrolled diabetes mellitus and hyperlipidemia.  At the time of  discharge yesterday, it was noted that he had been without any distress  over the past 3 to 4 days.  He had been cooperative.  He appeared  comfortable and it was decided to discharge him to the [facility].   PAST PSYCHIATRIC HISTORY:  The patient was previously with [place] around  this time in 2005.  He again, at that time, was treated for depression  with suicidal ideation.   SOCIAL HISTORY:  He graduated in 1977 with a BA.  He has been married  once and divorced.  He has no children.  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  He is currently  living with his mother and his mother and sister are financially  supporting him.  He has applied for disability and has been declined at  least once.   FAMILY HISTORY:  He has a younger  sister who has been treated for  depression.   ALCOHOL AND DRUG HISTORY:  He states that he has been drinking 48 ounces  of beer nightly but he can give it up.   PRIMARY CARE [facility]:  Dr. Orpah Melter.   MEDICAL PROBLEMS:  1. Diabetes.  2. Hyperlipidemia.   MEDICATIONS AT THE TIME OF TRANSFER FROM THE MAIN HOSPITAL:  1. He is currently prescribed Xanax 0.5 mg p.o. b.i.d.  2. Norvasc 10 mg p.o. daily.  3. Aspirin 81 mg p.o. daily.  4. Folic acid 1 mg p.o. daily.  5. Glipizide 10 mg p.o. b.i.d.  6. Lantus 31 units subcu at h.s.  7. Multivitamins 1 tablet p.o. daily.  8. Protonix 40 mg p.o. daily.  9. Senokot 1 tablet p.o. daily.  10.Thiamine 100 mg p.o. daily.  11.Effexor 225 mg p.o. daily.  12.Albuterol MDI 2 puffs p.r.n. every q.8 h.   POSITIVE PHYSICAL FINDINGS:  His physical exam is well documented and on  the chart.  He currently is not reporting any positives on his review of  systems.  His vital signs show he is 67 inches tall.  He weighs 150.  Temperature is 98.4.  Blood pressure is 113/73 to 105/57.  Pulse is 104  to 105 and respirations are 18.   MENTAL STATUS EXAM:  Today, he is alert and oriented.  He is  appropriately groomed, dressed, and nourished.  He has on his own  clothes from home.  His speech is brief.  He states he does not like to  speak about these things.  His mood is depressed as is his affect.  His  thought processes are clear, rational, and goal oriented.  He asks that  we arrange for followup through the [facility] in [place] where he has previously been treated.  This is to help  specifically with his medication costs.  Judgment and insight appear to  be intact.  Concentration and memory are intact.  He still reports  occasional suicidal ideation.  He is not having any homicidal ideation  and he has no auditory or visual hallucinations.   AXIS I DIAGNOSES:  1. Major depressive disorder, recurrent, severe, with serious  suicide      attempt by overdose.  2. Alcohol dependence, in early remission.  He states that he can just      stop drinking.   AXIS II:  Deferred.   AXIS III:  1. Diabetes mellitus.  2. Hyperlipidemia.   AXIS IV:  Severe.  He is unemployed and he reports that back in 1977 he  was given a misdemeanor for crimes against nature and this prevents him  from being able to obtain employment.   PLAN:  Admit for safety and stabilization.  We will adjust his meds as  indicated and we will have the case manager work on collaboration with  the [facility] to get his outpatient care established.   ESTIMATED LENGTH OF STAY:  Two to 3 days.      Mickie Kerry Dory, P.A.-C.      Stark Jock, M.D.  Electronically Signed    MD/MEDQ  D:  12/09/2006  T:  12/10/2006  Job:  [id number]

## 2010-06-21 NOTE — Consult Note (Signed)
NAMETAURIN, GUILFOYLE NO.:  [id number]   MEDICAL RECORD NO.:  [id number]          PATIENT TYPE:  INP   LOCATION:  [place]                         FACILITY:  [facility]   PHYSICIAN:  Felizardo Hoffmann, M.D.  DATE OF BIRTH:  Aug 11, 1950   DATE OF CONSULTATION:  12/04/2006  DATE OF DISCHARGE:                                 CONSULTATION   Johnny [name] continues with depressed mood for energy, difficulty,  concentration, anhedonia, thoughts of hopelessness and helplessness.  His orientation has returned.  His memory ability has returned.  He is  not having any hallucinations or delusions.   He does acknowledge drinking at least four beers a day.   Johnny [name] denies any history of elevated mood, high energy, racing  thoughts, decreased need for sleep.   LABORATORY DATA:  SGOT slightly elevated at 44.  SGPT is elevated at 93.  The SGPT was higher yesterday at 129.   PHYSICAL EXAMINATION:  VITAL SIGNS:  Temperature 99, pulse 91,  respiratory rate 20, blood pressure 130/54.  O2 saturation on room air  98%.  MENTAL STATUS:  Johnny [name] does acknowledge suicidal intent.  He has no  thoughts of harming others.  He has no hallucinations or delusions.  He  is oriented to all spheres.  He is alert.  Attention span is slightly  decreased.  Eye contact is good.  Affect is constricted.  Mood is  depressed.  Memory is intact except for the delirium.  His speech is  soft.  There is normal prosody.  There is no dysarthria.  Thought  process is logical, coherent, goal-directed.  No looseness of  associations.  Judgment is intact.   ASSESSMENT:   AXIS I:  1. Alcohol dependence.  2. Major depressive disorder, recurrent, severe.   RECOMMENDATIONS:  1. Would continue the sitter.  2. Would admit to a psychiatric unit for further evaluation and      treatment when medically clear.  3. Would increase the patient's Effexor to 187.5 mg XR daily and      continue to increase it by 37.5 mg every  other day while monitoring      blood pressure for adverse effect.  4. Thiamine 100 mg daily.  5. Multivitamin daily.  6. Folic acid 1 mg daily.      Felizardo Hoffmann, M.D.  Electronically Signed     JW/MEDQ  D:  12/04/2006  T:  12/04/2006  Job:  [id number]

## 2010-06-21 NOTE — Consult Note (Signed)
NAMEAUBRY, RIEHM NO.:  [id number]   MEDICAL RECORD NO.:  [id number]          PATIENT TYPE:  INP   LOCATION:  [place]                         FACILITY:  [facility]   PHYSICIAN:  Felizardo Hoffmann, M.D.  DATE OF BIRTH:  08/06/1950   DATE OF CONSULTATION:  01/01/2007  DATE OF DISCHARGE:  01/04/2007                                 CONSULTATION   REQUESTING PHYSICIAN:  [facility].   REASON FOR CONSULTATION:  Overdose.   HISTORY OF PRESENT ILLNESS:  Mr. Algis Yambao is a 60 year old male  admitted to the [facility] on November 24 after a drug  overdose.  The patient overdosed on Tylenol and Benadryl.  He has been  placed on a one-to-one suicide watch.   The patient has depressed mood, poor concentration.  He acknowledges  suicidal intent.  He does have a history of noncompliance with  medications.   He is currently on:  1. Effexor 75 mg b.i.d.  2. He is on a dose of Librium 25 mg b.i.d.  3. Desyrel 50 mg every night.   WBC 7.9, hemoglobin 14.8, platelet count 350.  SGOT 16, SGPT 17.  Urine  drug screen was negative.  Alcohol negative.  Tylenol negative.  Please  see the attending's general medical assessment.   MENTAL STATUS EXAM:  The patient is alert.  Thought process within  normal limits.  Oriented to all spheres.  Thought content:  Acknowledges  suicidal intent.  Judgment is intact for the need of treatment.   ASSESSMENT:  AXIS I:  293.83, mood disorder not otherwise specified,  depressed.   RECOMMENDATIONS:  Transfer to the [facility] psychiatric unit.  The  patient will continue on the Effexor 75 mg t.i.d.  He is currently on  Librium 25 mg b.i.d. as a withdrawal precaution.      Felizardo Hoffmann, M.D.  Electronically Signed     JW/MEDQ  D:  06/18/2007  T:  06/18/2007  Job:  [id number]

## 2010-06-21 NOTE — Discharge Summary (Signed)
Johnny [name], RIESEN NO.:  [id number]   MEDICAL RECORD NO.:  [id number]          PATIENT TYPE:  IPS   LOCATION:  0601                          FACILITY:  [facility]   PHYSICIAN:  Stark Jock, M.D. DATE OF BIRTH:  [birth year]   DATE OF ADMISSION:  12/08/2006  DATE OF DISCHARGE:  12/14/2006                               DISCHARGE SUMMARY   IDENTIFICATION:  This is a 60 year old divorced white male who was  admitted on a voluntary basis on December 08, 2006.   HISTORY OF PRESENT ILLNESS:  Apparently, the patient became despondent.  He has been unemployed for the past year.  He had recently become  deliberately not taking his medications and on November 30, 2006, he  states he could not resist suicidal ideation anymore.  He went out and  bought over-the-counter Tylenol p.m. and overdosed.  He was admitted to  the [facility] from November 30, 2006 to December 08, 2006.  He was  treated for his major depression with intentional overdose on Tylenol  PM.  He will was treated for his alcohol dependence for acute renal  insufficiency.  His elevated liver enzymes, mild hepatitis, uncontrolled  diabetes mellitus, and hyperlipidemia were also noted.  At the time of  discharge from the main hospital, it was noted that he had been without  any of his stress over the past 3-4 days.  He had been cooperative.  He  appeared comfortable and it was decided to discharge him to [facility].  The patient was previously with [place] around this time in  2005.  He again at that time was treated for depression and suicidal  ideation.  The patient has a younger sister who is treated for  depression.  The patient states he has been drinking 48 ounces of beer  nightly, but can give it up.  The patient has diabetes and  hyperlipidemia.  He is on multiple medications.  See hospital course.   PHYSICAL EXAMINATION:  Physical exam is well documented on the chart.  There were no acute  physical or medical problems noted.  Admission  laboratories were done in the hospital prior to transfer here.  They  were evaluated by the physician there.   HOSPITAL COURSE:  Upon admission, the patient was started on Xanax 0.5  mg p.o. b.i.d., Norvasc 10 mg daily, aspirin 81 mg daily, folic acid 1  mg daily,  Glipizide 10 mg p.o. b.i.d., Lantus 31 units subcutaneous  h.s., multivitamin 1 tablet daily, Protonix 40 mg daily, Senokot 1  tablet daily, thiamine 100 mg daily, Effexor XR 225 mg daily, albuterol  MDI 2 puffs q. 6h. p.r.n.  He was also started on Ambien 5 mg p.o.  q.h.s. p.r.n. insomnia.  On December 11, 2006, the Effexor was changed  from a.m. to h.s. due to his daytime sedation.  He was also placed on a  sliding scale insulin order as per our glycemic control protocol.  He  was able to tolerate his medications well with no significant side  effects.  He was friendly and cooperative  in sessions with me.  He was  able to participate appropriately in unit therapeutic groups and  activities.  He stated he was not working now.  He had been a small  truck driver.  He admits the use 48 ounces of beer nightly.  He does not  feel he needs assistance to stopping alcohol.  He was placed on Effexor  XR 75 mg b.i.d. and Xanax 0.5 mg b.i.d.  The Effexor was just increased  to 225 mg daily and therefore was not increased any further after  admission here.  He still had occasional suicidal ideation.  He has  financial stressors and is out of work.  He has legal problems that date  back in the past and he states it has made it hard for him to get work.  He continued to feel tired, depressed, and anxious.  Appetite began to  improve.  Sleep; there is still some difficulty with waking up in the  middle of the night.  As hospitalization progressed, he became less  depressed and less anxious.  He was still having occasional suicidal  ideation at times it comes and goes, but contracts for safety in  the  hospital.  His suicidal ideation resolved by December 12, 2006.  On  December 13, 2006, he was sleeping well.  Appetite was fair.  Mood was  less depressed and anxious.  There was no suicidal ideation.  He  discussed his financial stressors and stated his sister is probably  going to help him.  He also needs a job, he stated, and has not worked  for over a year.  On December 14, 2006, mental status had improved  markedly from admission status.  The patient was friendly and  cooperative with good eye contact.  Speech was normal rate and flow.  Psychomotor activity was within normal limits.  Mood, euthymic.  Affect,  wide range.  There was no suicidal or homicidal ideation.  No thoughts  of self-injurious behavior.  No auditory or visual hallucinations.  No  paranoia or delusions.  Thoughts were logical and goal-directed.  Thought content, no predominant theme.  Cognitive was grossly back to  baseline.  The patient was felt to be safe to be discharged today.  His  sister was coming to pick him.   DISCHARGE DIAGNOSES:  AXIS I:  Major depressive disorder, recurrent,  severe without psychosis.  Rule out alcohol dependence.  AXIS II:  None.  AXIS III:  Diabetes mellitus and hyperlipidemia.  AXIS IV:  Severe (unemployed and reports that 1977 he was given a  misdemeanor, which prevents him from being able to obtain employment,  burden of psychiatric illness, and burden of medical illness).  AXIS V:  Global assessment of functioning was 50 upon discharge.  Global  assessment of functioning was 35 upon admission.  Global assessment of  functioning highest past year was 70-75.   DISCHARGE PLAN:  There were no specific activity levels or dietary  restrictions.   POSTHOSPITAL CARE PLAN:  The patient will see Dr. Darylene Price at the [facility] on December 20, 2006, at 8:30 a.m..   DISCHARGE MEDICATIONS:  Lantus insulin 31 units at bedtime, Protonix 40  mg daily, Xanax 0.5 mg twice daily,  Norvasc 10 mg daily, aspirin 81 mg  daily, folic acid 1 mg daily, Glucotrol 10 mg twice daily with food,  multivitamin 1 pill daily, thiamine 100 mg daily, Senokot tablet 1 pill  daily, Effexor XR 225 mg daily, Ambien  5 mg at bedtime p.r.n. insomnia,  and albuterol inhaler 2 puffs every 6 hours as needed for shortness of  breath.      Stark Jock, M.D.  Electronically Signed     BHS/MEDQ  D:  12/14/2006  T:  12/15/2006  Job:  [id number]

## 2010-06-24 NOTE — H&P (Signed)
NAME:  Johnny [name], Johnny [name] NO.:  [id number]   MEDICAL RECORD NO.:  [id number]          PATIENT TYPE:  IPS   LOCATION:  0304                          FACILITY:  [facility]   PHYSICIAN:  Rulon Eisenmenger, M.D. DATE OF BIRTH:  [birth year]   DATE OF ADMISSION:  11/24/2003  DATE OF DISCHARGE:                         PSYCHIATRIC ADMISSION ASSESSMENT   IDENTIFYING INFORMATION:  This is a 60 year old single white male,  voluntarily admitted on November 24, 2003.   HISTORY OF PRESENT ILLNESS:  The patient presents with a history of passive  suicidal thoughts.  The patient stopped taking his medications for  approximately a week, hoping to do away with self.  He states his mother  found out about what he was doing and advised him to seek help.  The patient  states that he has thought about doing this before as well.  He states that  his stressors are his medical problems, his financial issues, emotional  problems.  He is separated from his wife.  His sleep has been satisfactory.  The patient did go to the emergency department initially for some complaints  of abdominal pain where he also found out at that time that he had a kidney  stone and upon further questioning the patient was expressing thoughts of  suicidal ideation which prompted this admission to [facility].  The patient also had a past history of passive suicidal thinking,  approximately 5 weeks ago.  The patient also reports a history of taking  caffeine tablets to keep himself awake, has been taking 10-12 a day for the  past 3 years.   PAST PSYCHIATRIC HISTORY:  First admission to [facility], no  current outpatient treatment, no prior suicide attempt.   SOCIAL HISTORY:  A 60 year old single white male, separated for 5 years,  married for 22 years.  He has no children.  His wife is currently living in  [place].  The patient is living with his mother.  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   FAMILY HISTORY:  Reports his aunt had committed suicide.   ALCOHOL DRUG HISTORY:  The patient smokes a pipe.  He denies any consistent  alcohol use, states he will drink a few beers on a weekend, denies any drug  use.   PAST MEDICAL HISTORY:  Primary care Sevon Rotert:  The patient has a urologist,  Dr. Doran Stabler.  Medical problems are type 2 diabetes, on insulin,  history of kidney stone, hypertension, elevated cholesterol.   MEDICATIONS:  The patient has been on Effexor XR 150 mg for years, Lipitor  20 mg daily, Actos 30 mg daily, Cozaar 100 mg daily, Humalog 15 units t.i.d.  and Lantus 30 units at bedtime.   DRUG ALLERGIES:  No known allergies.   PHYSICAL EXAMINATION:  The patient was assessed at [facility] in the  emergency department.  He is somewhat unkempt, in no acute distress.  97.7  temperature, 84 heart rate, 20 respirations, five feet 8 inches tall, 162  pounds.  His glucose was 242, BUN was elevated at 37 with  a creatinine of  2.7.  TSH is 5.183.  Hemoglobin 11, hematocrit 32.  His CT scan of the  pelvis showed a left nephrolithiasis with moderate left hydronephrosis and  proximal left hydro ureter.  Also the impression was of a 5 ml calculus at  or just beyond the left ureteral orifice with left ureterectasis.  The  patient received some Percocet and some Phenergan as well and received  fluids.  Also received morphine 6 mg and Toradol 30 mg IV   MENTAL STATUS EXAM:  This is a middle-aged, disheveled male, cooperative,  quiet.  Speech is clear, the patient providing evasive answers.  The patient  feels depressed, affect is constricted.  Thought processes are coherent with  no evidence of psychosis.  Cognitive function intact.  Memory is good,  judgment is fair, insight is minimal.   ADMISSION DIAGNOSES:   AXIS I:  Major depressive disorder, severe, recurrent.   AXIS II:  Deferred.   AXIS III:  Hypertension, type 2 diabetes, elevated cholesterol,  kidney  stone.   AXIS IV:  Problems with primary support group, psychosocial problems,  medical problems.   AXIS V:  Current is 25, this past year is 59.   PLAN:  Plan is admission for suicidal thoughts.  Contract for safety,  stabilize mood and thinking.  We will consult internal medicine for  monitoring labs and current kidney stone and the patient's diabetes.  The  patient is to increase coping skills.  The patient is to attend individual  and group therapy.  We will go ahead and decrease the patient's Effexor XR  as it has been ineffective and initiate Lexapro to decrease depressive  symptoms.  We will have Seroquel available for any agitation or anxiety that  the patient may experience.  The patient is to follow up with mental health,  to be medication compliant, and we will consider a family session with his  mother for support.   TENTATIVE LENGTH OF CARE:  5-7 days.     Jani   JO/MEDQ  D:  11/27/2003  T:  11/27/2003  Job:  [id number]

## 2010-06-24 NOTE — Consult Note (Signed)
NAME:  [name], [name] NO.:  [id number]   MEDICAL RECORD NO.:  [id number]          PATIENT TYPE:  IPS   LOCATION:  0304                          FACILITY:  [facility]   PHYSICIAN:  Jerelene Redden, MD      DATE OF BIRTH:  [birth year]   DATE OF CONSULTATION:  DATE OF DISCHARGE:                                   CONSULTATION   Johnny [name] is a 60 year old man with a 12 year history of diabetes.  Diabetes has been complicated by retinopathy requiring laser treatments,  neuropathy with painful feet and nephropathy.  The patient recalls that he  saw Dr. Moshe Cipro about one year and a half ago for evaluation of  neuropathy and was placed on Cozaar at that time.  He was not sure what his  renal function was at that point.  The patient's normal insulin and Actos  treatment is outlined below.  He has been taking his insulin intermittently  and has been taking no other medications because of profound depression and  a desire to die.  Not surprisingly, he has been having urinary frequency and  nocturia.  He actually presented to the emergency room last night because of  left flank pain.  He was found to have some hematuria and was sent for a CT  scan of the abdomen, which showed a kidney stone in the left ureter with  associated hydronephrosis.  Pain has since resolved.  He states that he is  having no flank pain at this time.  He was admitted to [facility]  because of suicidal ideation.  We were asked to see him to make sure that he  was receiving appropriate medications.  He has not been monitoring his blood  sugars.  On admission his glucose was 242.  He denies any recent history of  breathing difficulty, chest pain, nausea, vomiting, dysuria, hematemesis or  melena.   PAST MEDICAL HISTORY:   ALLERGIES:  He states he has no known drug allergies.   CURRENT MEDICATIONS:  1.  Actos 30 mg daily.  2.  Humalog insulin a.c. meals.  3.  Lantus 30 units at bedtime.  4.   Cozaar 100 mg daily.  5.  Lipitor 10 mg daily.  6.  Effexor 150 mg in the morning, 75 mg in the evening.   OPERATIONS:  He has had a previous appendectomy.   MEDICAL ILLNESSES:  He cannot recall any other medical illnesses, except  that he does recall being hospitalized on one occasion in the past because  of a mental problem.  He states that he has no history of heart disease or  stroke.   FAMILY HISTORY:  His uncle had a history of diabetes.  His father died of an  infection while he was hospitalized.  His mother is in good health.   SOCIAL HISTORY:  The patient states that he was previously a Education officer, museum.  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  He smokes a pipe.  He will drink  about three beers  per week.  He denies drug abuse.   REVIEW OF SYSTEMS:  HEAD:  He denies headache or dizziness.  EYES:  He  states that his visual acuity is decreased.  EAR, NOSE AND THROAT:  He  denies ear aches, sinus pain or sore throat.  CHEST:  He has an occasional  cough.  CARDIOVASCULAR:  He denies orthopnea, PND, ankle edema or exertional  chest pain.  GI:  He denies nausea, vomiting, abdominal pain, change in  bowel habits, melena or hematochezia.  GU:  See above.  NEURO:  No history  of seizure or stroke.  ENDO:  He denies excessive thirst, urinary frequency  or nocturia.   PHYSICAL EXAMINATION:  VITAL SIGNS:  Temperature is 97; pulse is 84;  respirations 20; blood pressure was 140/90.  HEENT:  Exam within normal limits.  CHEST:  Remarkable for few scattered rhonchi.  CARDIOVASCULAR:  Normal S1 and S2 without rubs, murmurs or gallops.  ABDOMEN:  Benign and normal bowel sounds without masses or tenderness.  NEUROLOGIC:  Testing within normal limits.  EXTREMITIES:  No evidence of cyanosis or edema.   As mentioned, his glucose was 242.  Creatinine was 2.7.  Urinalysis was  remarkable for hematuria.   IMPRESSION:  1.   Uncontrolled diabetes.  2.  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  3.  Hypertension.  4.  Severe depression.  5.  Left sided ureteral lithiasis with hydroureter and hematuria.  The      patient had flank pain yesterday, which is now resolved.   PLAN:  1.  We will resume the patient's usual medications, but hold Cozaar in light      of creatinine of 2.7, at least to make sure that the creatinine is not      rising.  2.  It will be important to follow the patient's renal status closely.      Providing he remains pain free, he needs a follow-up study of his kidney      approximately Thursday.  If he is going to be discharged on Thursday,      probably the best way to follow-up is going to be to arrange outpatient      evaluation by [facility]rolog[facility].  If he is going      to be in the hospital longer than that, then probably we will need to      arrange urology follow-up while he is still in the hospital.  We will      discuss this with you.  My colleagues will continue to follow the      patient here in [place].      SY/MEDQ  D:  11/24/2003  T:  11/25/2003  Job:  [id number]   cc:   Orpah Melter, M.D.  [address] [place]  [place] [address]  Fax: [phone]   Carlton Adam, M.D.

## 2010-06-24 NOTE — Discharge Summary (Signed)
NAME:  Johnny [name], Johnny [name] NO.:  [id number]   MEDICAL RECORD NO.:  [id number]          PATIENT TYPE:  IPS   LOCATION:  0304                          FACILITY:  [facility]   PHYSICIAN:  Rulon Eisenmenger, M.D. DATE OF BIRTH:  Nov 14, 1950   DATE OF ADMISSION:  11/24/2003  DATE OF DISCHARGE:  11/29/2003                                 DISCHARGE SUMMARY   IDENTIFYING DATA:  This is a 60 year old single Caucasian male voluntarily  admitted.  Presenting with a history of passive suicidal thoughts.  Stopping  medications approximately a week ago, hoping to do away with himself.  States that mother found out what he had been doing and advised that he seek  help.  Reports stressors include medical problems, financial stressors and  emotional problems.  The patient initially presented in the ER for  complaints of abdominal pain.  Found out that he had a kidney stone and also  was expressing suicidal thoughts while evaluated, which prompted this  admission.  The patient had a history of passive suicidal ideation  approximately five weeks ago and history of taking caffeine tablets to keep  himself awake.  Had been taking 10-12 a day for the past three years.  The  patient reports occasional beer and alcohol use on weekends, drinking 2-3  over the weekend.  Smokes a pipe. Denies consistent alcohol use or any other  drug use.  The patient has diabetes, type 2, on insulin, history of kidney  stones, hypertension, elevated cholesterol.   MEDICATIONS:  Effexor XR, Lipitor, Actos, Cozaar, Humalog and Lantus.   ALLERGIES:  No known drug allergies.   PHYSICAL EXAMINATION:  Physical and neurological exam within normal limits.   LABORATORY DATA:  Routine admission labs essentially within normal limits.  Glucose was elevated at 242, BUN elevated at 37, creatinine 2.7.  TSH 5.183.  Hemoglobin and hematocrit was 11 and 32, respectively.  CT of pelvis showed  left nephrolithiasis with moderate left  hydronephrosis, proximal left  hydroureter.   MENTAL STATUS EXAM:  Middle-aged disheveled male.  Cooperative.  Quiet.  Speech clear, providing somewhat evasive answers, feeling depressed.  Affect  constricted.  Thought processes goal directed.  No evidence of psychosis.  Cognitively intact.  Judgment and insight were fair.   ADMISSION DIAGNOSES:   AXIS I:  Major depressive disorder, severe, recurrent.   AXIS II:  Deferred.   AXIS III:  1.  Hypertension.  2.  Type 2 diabetes.  3.  Elevated cholesterol.  4.  Kidney stones.   AXIS IV:  Moderate (problems with primary support group and psychosocial  issues and other medical problems).   AXIS V:  25/55-60.   HOSPITAL COURSE:  The patient was admitted and ordered routine p.r.n.  medications and underwent further monitoring.  Was encouraged to participate  in individual, group and milieu therapy.  Internal medicine was consulted  for close medical monitoring due to his medical complexity and multiple  medical conditions.  The patient placed on safety checks.  Seroquel was used  p.r.n. agitation and anxiety and patient was educated regarding healthier  coping skills and importance of compliance of medications.  The patient was  resumed on psychotropics and medical medications.  Required sliding scale  insulin.  Internal medicine consultation and all recommendations were  followed.  Lexapro was started targeting depressive symptoms and optimized.  The patient was gradually tapered with a slow taper off of Effexor to avoid  discontinuation syndrome as he was switched to Lexapro due to lack of  response and questionable compliance worsening his mood due to short half-  life and norepinephrine side effects, including possible effect on blood  pressure.  The patient was gradually titrated up on Lexapro as he was  tapered down off of Effexor.  Family meeting was scheduled.  Family was  contacted regarding safety issues and possible  discharge planning.  The  patient did show positive improvement with clinical intervention.  Mood was  more stable.  There was no agitation.  No suicidal ideation.  No psychotic  symptoms.  The patient reported motivation to be compliant with medical  follow-up.  Was given medication education and discharged, again, with no  dangerous ideation or psychotic symptoms and mood was stable, medically  stable.   DISCHARGE MEDICATIONS:  1.  Ambien 10 mg q.h.s. p.r.n.  2.  Zocor 40 mg at 6 p.m.  3.  Actos 30 mg q.a.m.  4.  Cozaar 50 mg, 2 q.a.m.  5.  Lantus insulin 30 units in the morning.  6.  Novolin R 2-15 units three times a day p.r.n.  7.  Lexapro 10 mg, 1-1/2 q.a.m.  8.  Percocet 5/325 mg, 1 q.6h. p.r.n.  The patient was given a short three-      day supply and was advised to only continue this under close supervision      of an outpatient physician.   FOLLOW UP:  The patient was scheduled with urology and was to confirm follow-  up with therapist.  The patient was set up with [facility]  on Thursday, December 03, 2003 at 4 p.m. with Dr. Laverta Baltimore.   DISCHARGE DIAGNOSES:   AXIS I:  Major depressive disorder, severe, recurrent.   AXIS II:  Deferred.   AXIS III:  1.  Hypertension.  2.  Type 2 diabetes.  3.  Elevated cholesterol.  4.  Kidney stones.   AXIS IV:  Moderate (problems with primary support group and psychosocial  issues and other medical problems).   AXIS V:  Global Assessment of Functioning on discharge 55-60.     Jame   JEM/MEDQ  D:  01/03/2004  T:  01/03/2004  Job:  [id number]

## 2010-07-01 ENCOUNTER — Other Ambulatory Visit: Payer: Self-pay

## 2010-07-14 ENCOUNTER — Emergency Department (HOSPITAL_COMMUNITY)
Admission: EM | Admit: 2010-07-14 | Discharge: 2010-07-16 | Disposition: A | Discharge status: Other Acute Inpatient Hospital | Payer: Medicare Other | Attending: Emergency Medicine | Admitting: Emergency Medicine

## 2010-07-14 DIAGNOSIS — R45851 Suicidal ideations: Secondary | ICD-10-CM | POA: Insufficient documentation

## 2010-07-14 DIAGNOSIS — F39 Unspecified mood [affective] disorder: Secondary | ICD-10-CM

## 2010-07-14 DIAGNOSIS — F329 Major depressive disorder, single episode, unspecified: Secondary | ICD-10-CM | POA: Insufficient documentation

## 2010-07-14 DIAGNOSIS — E119 Type 2 diabetes mellitus without complications: Secondary | ICD-10-CM | POA: Insufficient documentation

## 2010-07-14 DIAGNOSIS — Z7982 Long term (current) use of aspirin: Secondary | ICD-10-CM | POA: Insufficient documentation

## 2010-07-14 DIAGNOSIS — Z79899 Other long term (current) drug therapy: Secondary | ICD-10-CM | POA: Insufficient documentation

## 2010-07-14 DIAGNOSIS — F3289 Other specified depressive episodes: Secondary | ICD-10-CM | POA: Insufficient documentation

## 2010-07-14 DIAGNOSIS — I10 Essential (primary) hypertension: Secondary | ICD-10-CM | POA: Insufficient documentation

## 2010-07-14 DIAGNOSIS — Z794 Long term (current) use of insulin: Secondary | ICD-10-CM | POA: Insufficient documentation

## 2010-07-14 LAB — GLUCOSE, CAPILLARY: Glucose-Capillary: 237 mg/dL — ABNORMAL HIGH (ref 70–99)

## 2010-07-14 LAB — COMPREHENSIVE METABOLIC PANEL
ALT: 25 U/L (ref 0–53)
AST: 19 U/L (ref 0–37)
Albumin: 4 g/dL (ref 3.5–5.2)
Alkaline Phosphatase: 86 U/L (ref 39–117)
BUN: 46 mg/dL — ABNORMAL HIGH (ref 6–23)
CO2: 19 mEq/L (ref 19–32)
Calcium: 9.9 mg/dL (ref 8.4–10.5)
Chloride: 102 mEq/L (ref 96–112)
Creatinine, Ser: 1.39 mg/dL (ref 0.4–1.5)
GFR calc Af Amer: >60 mL/min (ref >60)
GFR calc non Af Amer: 52 mL/min — ABNORMAL LOW (ref >60)
Glucose, Bld: 249 mg/dL — ABNORMAL HIGH (ref 70–99)
Potassium: 4.4 mEq/L (ref 3.5–5.1)
Sodium: 134 mEq/L — ABNORMAL LOW (ref 135–145)
Total Bilirubin: 0.2 mg/dL — ABNORMAL LOW (ref 0.3–1.2)
Total Protein: 7.2 g/dL (ref 6.0–8.3)

## 2010-07-14 LAB — CBC
HCT: 44 % (ref 39.0–52.0)
Hemoglobin: 14.4 g/dL (ref 13.0–17.0)
MCH: 28.6 pg (ref 26.0–34.0)
MCHC: 32.7 g/dL (ref 30.0–36.0)
MCV: 87.3 fL (ref 78.0–100.0)
Platelets: 292 10*3/uL (ref 150–400)
RBC: 5.04 MIL/uL (ref 4.22–5.81)
RDW: 13.8 % (ref 11.5–15.5)
WBC: 8.2 10*3/uL (ref 4.0–10.5)

## 2010-07-14 LAB — ETHANOL: Alcohol, Ethyl (B): <11 mg/dL — ABNORMAL HIGH (ref 0–10)

## 2010-07-14 LAB — RAPID URINE DRUG SCREEN, HOSP PERFORMED
Amphetamines: NOT DETECTED
Barbiturates: NOT DETECTED
Benzodiazepines: NOT DETECTED
Cocaine: NOT DETECTED
Opiates: NOT DETECTED
Tetrahydrocannabinol: NOT DETECTED

## 2010-07-15 ENCOUNTER — Encounter (HOSPITAL_COMMUNITY): Payer: Self-pay

## 2010-07-15 ENCOUNTER — Emergency Department (HOSPITAL_COMMUNITY): Payer: Medicare Other

## 2010-07-15 LAB — GLUCOSE, CAPILLARY: Glucose-Capillary: 259 mg/dL — ABNORMAL HIGH (ref 70–99)

## 2010-07-16 LAB — GLUCOSE, CAPILLARY: Glucose-Capillary: 250 mg/dL — ABNORMAL HIGH (ref 70–99)

## 2010-11-15 LAB — BASIC METABOLIC PANEL
BUN: 10
BUN: 11
CO2: 22
CO2: 23
CO2: 27
Calcium: 8.1 — ABNORMAL LOW
Calcium: 8.2 — ABNORMAL LOW
Calcium: 8.9
Calcium: 9.2
Chloride: 109
Chloride: 114 — ABNORMAL HIGH
Chloride: 114 — ABNORMAL HIGH
Creatinine, Ser: 1.21
Creatinine, Ser: 1.23
Creatinine, Ser: 1.25
Creatinine, Ser: 1.28
Creatinine, Ser: 1.67 — ABNORMAL HIGH
GFR calc Af Amer: 51 — ABNORMAL LOW
GFR calc Af Amer: >60
GFR calc Af Amer: >60
GFR calc non Af Amer: 42 — ABNORMAL LOW
GFR calc non Af Amer: 58 — ABNORMAL LOW
Glucose, Bld: 105 — ABNORMAL HIGH
Glucose, Bld: 401 — ABNORMAL HIGH
Glucose, Bld: 86
Sodium: 136
Sodium: 141
Sodium: 142

## 2010-11-15 LAB — CK TOTAL AND CKMB (NOT AT ARMC)
CK, MB: 0.9
Relative Index: INVALID
Total CK: 40

## 2010-11-15 LAB — URINALYSIS, ROUTINE W REFLEX MICROSCOPIC
Bilirubin Urine: NEGATIVE
Glucose, UA: >1000 — AB
Hgb urine dipstick: NEGATIVE
Ketones, ur: 15 — AB
Leukocytes, UA: NEGATIVE
Protein, ur: 100 — AB
Specific Gravity, Urine: 1.013
pH: 5.5
pH: 6

## 2010-11-15 LAB — DIFFERENTIAL
Basophils Absolute: 0
Basophils Relative: 0
Lymphocytes Relative: 22
Neutro Abs: 5.5
Neutrophils Relative %: 70

## 2010-11-15 LAB — COMPREHENSIVE METABOLIC PANEL
ALT: 17
AST: 16
Albumin: 2.9 — ABNORMAL LOW
Calcium: 8.2 — ABNORMAL LOW
GFR calc Af Amer: >60
Sodium: 141
Total Protein: 5.4 — ABNORMAL LOW

## 2010-11-15 LAB — RAPID URINE DRUG SCREEN, HOSP PERFORMED
Barbiturates: NOT DETECTED
Benzodiazepines: NOT DETECTED
Cocaine: NOT DETECTED
Opiates: NOT DETECTED

## 2010-11-15 LAB — CBC
Hemoglobin: 14.8
MCHC: 34.5
RDW: 12.9

## 2010-11-15 LAB — URINE CULTURE: Special Requests: NEGATIVE

## 2010-11-15 LAB — URINE MICROSCOPIC-ADD ON

## 2010-11-15 LAB — TROPONIN I: Troponin I: 0.04

## 2010-11-15 LAB — ACETAMINOPHEN LEVEL: Acetaminophen (Tylenol), Serum: <10 — ABNORMAL LOW

## 2010-11-16 LAB — BASIC METABOLIC PANEL
BUN: 20
BUN: 23
BUN: 9
CO2: 25
CO2: 27
Calcium: 8.7
Calcium: 9.4
Calcium: 9.7
Chloride: 104
Chloride: 108
Chloride: 108
Creatinine, Ser: 1.78 — ABNORMAL HIGH
Creatinine, Ser: 1.84 — ABNORMAL HIGH
GFR calc Af Amer: 46 — ABNORMAL LOW
GFR calc Af Amer: 49 — ABNORMAL LOW
GFR calc Af Amer: >60
GFR calc non Af Amer: 40 — ABNORMAL LOW
GFR calc non Af Amer: >60
Glucose, Bld: 644
Potassium: 3.6
Potassium: 3.8
Sodium: 135
Sodium: 138

## 2010-11-16 LAB — CBC
HCT: 37 — ABNORMAL LOW
HCT: 37.1 — ABNORMAL LOW
HCT: 44.3
Hemoglobin: 15.3
MCHC: 34.5
MCHC: 34.7
MCV: 88.6
MCV: 88.7
MCV: 88.8
Platelets: 230
Platelets: 251
Platelets: 267
RBC: 4.99
RDW: 13.1
RDW: 13.5
WBC: 6.2
WBC: 6.5

## 2010-11-16 LAB — CK TOTAL AND CKMB (NOT AT ARMC)
CK, MB: 0.8
Relative Index: INVALID
Relative Index: INVALID
Total CK: 20
Total CK: 22

## 2010-11-16 LAB — BASIC METABOLIC PANEL WITH GFR
BUN: 24 — ABNORMAL HIGH
Chloride: 97
GFR calc Af Amer: 48 — ABNORMAL LOW
Potassium: 5.1

## 2010-11-16 LAB — DIFFERENTIAL
Basophils Absolute: 0
Basophils Relative: 0
Eosinophils Absolute: 0
Eosinophils Relative: 0
Lymphocytes Relative: 11 — ABNORMAL LOW
Lymphs Abs: 0.7
Monocytes Absolute: 0.2
Monocytes Relative: 3
Neutro Abs: 5.6
Neutrophils Relative %: 86 — ABNORMAL HIGH

## 2010-11-16 LAB — HEPATIC FUNCTION PANEL
ALT: 20
AST: 15
AST: 171 — ABNORMAL HIGH
Albumin: 3.1 — ABNORMAL LOW
Albumin: 3.8
Alkaline Phosphatase: 66
Bilirubin, Direct: <0.1
Total Bilirubin: 1.4 — ABNORMAL HIGH
Total Protein: 5.4 — ABNORMAL LOW
Total Protein: 6.8

## 2010-11-16 LAB — URINE MICROSCOPIC-ADD ON

## 2010-11-16 LAB — COMPREHENSIVE METABOLIC PANEL
ALT: 129 — ABNORMAL HIGH
Albumin: 2.8 — ABNORMAL LOW
Albumin: 3 — ABNORMAL LOW
Alkaline Phosphatase: 46
BUN: 11
BUN: 11
Calcium: 9.1
Creatinine, Ser: 1.11
GFR calc Af Amer: >60
Potassium: 3.5
Potassium: 4.1
Sodium: 137
Total Protein: 5.3 — ABNORMAL LOW
Total Protein: 6.1

## 2010-11-16 LAB — PROTIME-INR
INR: 1
Prothrombin Time: 13.3

## 2010-11-16 LAB — POCT CARDIAC MARKERS
CKMB, poc: <1 — ABNORMAL LOW
Myoglobin, poc: 65.6
Operator id: 4295
Troponin i, poc: <0.05

## 2010-11-16 LAB — HEMOGLOBIN A1C: Mean Plasma Glucose: 485

## 2010-11-16 LAB — URINALYSIS, ROUTINE W REFLEX MICROSCOPIC
Bilirubin Urine: NEGATIVE
Glucose, UA: >1000 — AB
Hgb urine dipstick: NEGATIVE
Ketones, ur: 15 — AB
Leukocytes, UA: NEGATIVE
Nitrite: NEGATIVE
Protein, ur: 30 — AB
Specific Gravity, Urine: 1.033 — ABNORMAL HIGH
Urobilinogen, UA: 0.2
pH: 5

## 2010-11-16 LAB — TROPONIN I: Troponin I: 0.02

## 2010-11-16 LAB — APTT: aPTT: 21 — ABNORMAL LOW

## 2010-11-16 LAB — HEPATITIS C ANTIBODY: HCV Ab: NEGATIVE

## 2010-11-16 LAB — LIPID PANEL
Cholesterol: 246 — ABNORMAL HIGH
HDL: 38 — ABNORMAL LOW
Total CHOL/HDL Ratio: 6.5

## 2010-11-16 LAB — KETONES, QUALITATIVE: Acetone, Bld: NEGATIVE

## 2010-11-16 LAB — LIPASE, BLOOD: Lipase: 28

## 2010-11-16 LAB — AST: AST: 44 — ABNORMAL HIGH

## 2010-11-16 LAB — PSA: PSA: 0.83

## 2010-11-16 LAB — CALCIUM: Calcium: 9.6

## 2010-11-16 LAB — TSH: TSH: 2.26

## 2010-11-16 LAB — MAGNESIUM: Magnesium: 2.2

## 2013-10-28 DIAGNOSIS — H524 Presbyopia: Secondary | ICD-10-CM | POA: Diagnosis not present

## 2013-10-28 DIAGNOSIS — H2510 Age-related nuclear cataract, unspecified eye: Secondary | ICD-10-CM | POA: Diagnosis not present

## 2013-10-28 DIAGNOSIS — H5210 Myopia, unspecified eye: Secondary | ICD-10-CM | POA: Diagnosis not present

## 2013-10-28 DIAGNOSIS — H40019 Open angle with borderline findings, low risk, unspecified eye: Secondary | ICD-10-CM | POA: Diagnosis not present

## 2013-10-28 DIAGNOSIS — H52229 Regular astigmatism, unspecified eye: Secondary | ICD-10-CM | POA: Diagnosis not present

## 2013-10-28 DIAGNOSIS — E11329 Type 2 diabetes mellitus with mild nonproliferative diabetic retinopathy without macular edema: Secondary | ICD-10-CM | POA: Diagnosis not present

## 2014-08-27 ENCOUNTER — Emergency Department (HOSPITAL_COMMUNITY)
Admission: EM | Admit: 2014-08-27 | Discharge: 2014-08-28 | Disposition: A | Discharge status: Other Acute Inpatient Hospital | Payer: Medicare Other | Attending: Emergency Medicine | Admitting: Emergency Medicine

## 2014-08-27 ENCOUNTER — Encounter (HOSPITAL_COMMUNITY): Payer: Self-pay | Admitting: *Deleted

## 2014-08-27 DIAGNOSIS — R4689 Other symptoms and signs involving appearance and behavior: Secondary | ICD-10-CM

## 2014-08-27 DIAGNOSIS — R45851 Suicidal ideations: Secondary | ICD-10-CM | POA: Insufficient documentation

## 2014-08-27 DIAGNOSIS — R4589 Other symptoms and signs involving emotional state: Secondary | ICD-10-CM

## 2014-08-27 HISTORY — DX: Essential (primary) hypertension: I10

## 2014-08-27 HISTORY — DX: Hyperlipidemia, unspecified: E78.5

## 2014-08-27 HISTORY — DX: Type 2 diabetes mellitus without complications: E11.9

## 2014-08-27 LAB — RAPID URINE DRUG SCREEN, HOSP PERFORMED
AMPHETAMINES: NOT DETECTED
BARBITURATES: NOT DETECTED
BENZODIAZEPINES: NOT DETECTED
COCAINE: NOT DETECTED
OPIATES: NOT DETECTED
TETRAHYDROCANNABINOL: NOT DETECTED

## 2014-08-27 LAB — BASIC METABOLIC PANEL
Anion gap: 7 (ref 5–15)
BUN: 21 mg/dL — ABNORMAL HIGH (ref 6–20)
CHLORIDE: 108 mmol/L (ref 101–111)
CO2: 23 mmol/L (ref 22–32)
Calcium: 8.7 mg/dL — ABNORMAL LOW (ref 8.9–10.3)
Creatinine, Ser: 2.06 mg/dL — ABNORMAL HIGH (ref 0.61–1.24)
GFR calc non Af Amer: 32 mL/min — ABNORMAL LOW (ref >60)
GFR, EST AFRICAN AMERICAN: 37 mL/min — AB (ref >60)
GLUCOSE: 245 mg/dL — AB (ref 65–99)
POTASSIUM: 4.6 mmol/L (ref 3.5–5.1)
Sodium: 138 mmol/L (ref 135–145)

## 2014-08-27 LAB — COMPREHENSIVE METABOLIC PANEL
ALT: 33 U/L (ref 17–63)
AST: 25 U/L (ref 15–41)
Albumin: 4.4 g/dL (ref 3.5–5.0)
Alkaline Phosphatase: 55 U/L (ref 38–126)
Anion gap: 12 (ref 5–15)
BUN: 25 mg/dL — AB (ref 6–20)
CO2: 19 mmol/L — AB (ref 22–32)
Calcium: 9.7 mg/dL (ref 8.9–10.3)
Chloride: 106 mmol/L (ref 101–111)
Creatinine, Ser: 2.21 mg/dL — ABNORMAL HIGH (ref 0.61–1.24)
GFR, EST AFRICAN AMERICAN: 34 mL/min — AB (ref >60)
GFR, EST NON AFRICAN AMERICAN: 30 mL/min — AB (ref >60)
GLUCOSE: 321 mg/dL — AB (ref 65–99)
Potassium: 4.4 mmol/L (ref 3.5–5.1)
SODIUM: 137 mmol/L (ref 135–145)
TOTAL PROTEIN: 7.6 g/dL (ref 6.5–8.1)
Total Bilirubin: 0.8 mg/dL (ref 0.3–1.2)

## 2014-08-27 LAB — CBC
HEMATOCRIT: 44.1 % (ref 39.0–52.0)
Hemoglobin: 15.3 g/dL (ref 13.0–17.0)
MCH: 30.1 pg (ref 26.0–34.0)
MCHC: 34.7 g/dL (ref 30.0–36.0)
MCV: 86.8 fL (ref 78.0–100.0)
Platelets: 267 10*3/uL (ref 150–400)
RBC: 5.08 MIL/uL (ref 4.22–5.81)
RDW: 13.7 % (ref 11.5–15.5)
WBC: 7.6 10*3/uL (ref 4.0–10.5)

## 2014-08-27 LAB — SALICYLATE LEVEL

## 2014-08-27 LAB — ACETAMINOPHEN LEVEL: Acetaminophen (Tylenol), Serum: <10 ug/mL — ABNORMAL LOW (ref 10–30)

## 2014-08-27 LAB — ETHANOL: Alcohol, Ethyl (B): <5 mg/dL (ref <5)

## 2014-08-27 LAB — CBG MONITORING, ED: Glucose-Capillary: 211 mg/dL — ABNORMAL HIGH (ref 65–99)

## 2014-08-27 MED ORDER — LORAZEPAM 1 MG PO TABS: 0.0000 mg | ORAL_TABLET | Freq: Two times a day (BID) | ORAL | Status: DC

## 2014-08-27 MED ORDER — ONDANSETRON HCL 4 MG PO TABS: 4.0000 mg | ORAL_TABLET | Freq: Three times a day (TID) | ORAL | Status: DC | PRN

## 2014-08-27 MED ORDER — SODIUM CHLORIDE 0.9 % IV BOLUS (SEPSIS)
1000.0000 mL | Freq: Once | INTRAVENOUS | Status: AC
  Administered 2014-08-27: 1000 mL via INTRAVENOUS

## 2014-08-27 MED ORDER — LORAZEPAM 1 MG PO TABS
0.0000 mg | ORAL_TABLET | Freq: Four times a day (QID) | ORAL | Status: DC
  Administered 2014-08-27: 1 mg via ORAL
  Filled 2014-08-27: qty 1

## 2014-08-27 MED ORDER — LORAZEPAM 1 MG PO TABS: 1.0000 mg | ORAL_TABLET | Freq: Three times a day (TID) | ORAL | Status: DC | PRN

## 2014-08-27 MED ORDER — LISINOPRIL 10 MG PO TABS
5.0000 mg | ORAL_TABLET | Freq: Every day | ORAL | Status: DC
  Administered 2014-08-28: 5 mg via ORAL
  Filled 2014-08-27: qty 1

## 2014-08-27 MED ORDER — INSULIN ASPART 100 UNIT/ML ~~LOC~~ SOLN: 0.0000 [IU] | Freq: Three times a day (TID) | SUBCUTANEOUS | Status: DC

## 2014-08-27 MED ORDER — ZOLPIDEM TARTRATE 5 MG PO TABS: 5.0000 mg | ORAL_TABLET | Freq: Every evening | ORAL | Status: DC | PRN

## 2014-08-27 MED ORDER — ALUM & MAG HYDROXIDE-SIMETH 200-200-20 MG/5ML PO SUSP: 30.0000 mL | ORAL | Status: DC | PRN

## 2014-08-27 MED ORDER — LORAZEPAM 2 MG/ML IJ SOLN: 0.0000 mg | Freq: Four times a day (QID) | INTRAMUSCULAR | Status: DC

## 2014-08-27 MED ORDER — FLUOXETINE HCL 20 MG PO TABS
20.0000 mg | ORAL_TABLET | Freq: Every day | ORAL | Status: DC
  Administered 2014-08-28: 20 mg via ORAL
  Filled 2014-08-27: qty 1

## 2014-08-27 MED ORDER — NICOTINE 21 MG/24HR TD PT24: 21.0000 mg | MEDICATED_PATCH | Freq: Every day | TRANSDERMAL | Status: DC

## 2014-08-27 MED ORDER — ACETAMINOPHEN 325 MG PO TABS: 650.0000 mg | ORAL_TABLET | ORAL | Status: DC | PRN

## 2014-08-27 MED ORDER — INSULIN GLARGINE 100 UNIT/ML ~~LOC~~ SOLN
50.0000 [IU] | Freq: Every morning | SUBCUTANEOUS | Status: DC
  Administered 2014-08-28: 50 [IU] via SUBCUTANEOUS
  Filled 2014-08-27 (×2): qty 0.5

## 2014-08-27 MED ORDER — LORAZEPAM 2 MG/ML IJ SOLN: 0.0000 mg | Freq: Two times a day (BID) | INTRAMUSCULAR | Status: DC

## 2014-08-27 MED ORDER — INSULIN ASPART 100 UNIT/ML ~~LOC~~ SOLN
0.0000 [IU] | SUBCUTANEOUS | Status: DC
  Administered 2014-08-27: 8 [IU] via SUBCUTANEOUS
  Administered 2014-08-28: 4 [IU] via SUBCUTANEOUS
  Administered 2014-08-28: 8 [IU] via SUBCUTANEOUS
  Administered 2014-08-28: 12 [IU] via SUBCUTANEOUS
  Filled 2014-08-27 (×3): qty 1

## 2014-08-27 MED ORDER — THIAMINE HCL 100 MG/ML IJ SOLN: 100.0000 mg | Freq: Every day | INTRAMUSCULAR | Status: DC

## 2014-08-27 MED ORDER — VITAMIN B-1 100 MG PO TABS
100.0000 mg | ORAL_TABLET | Freq: Every day | ORAL | Status: DC
  Administered 2014-08-28: 100 mg via ORAL
  Filled 2014-08-27: qty 1

## 2014-08-27 MED ORDER — ATENOLOL 25 MG PO TABS
25.0000 mg | ORAL_TABLET | Freq: Every day | ORAL | Status: DC
  Administered 2014-08-28: 25 mg via ORAL
  Filled 2014-08-27: qty 1

## 2014-08-27 NOTE — ED Notes (Signed)
Diet ordered from service response (regular, safe tray)

## 2014-08-27 NOTE — ED Provider Notes (Signed)
CSN: [id number]     Arrival date & time 08/27/14  71 History   First MD Initiated Contact with Patient 08/27/14 1729     Chief Complaint  Patient presents with  . Suicidal     (Consider location/radiation/quality/duration/timing/severity/associated sxs/prior Treatment) HPI Comments: Patient here accompanied by sister and brother-in-law for suicidal ideation. History of depression and prior suicide attempts. He has recent labs and stressful life events: He had financial trouble do a car and he had a fight with his other sister. He said his sister and email yesterday after the fight after they had reconciled saying, "it'll all be over soon." His sister he reported he was trying to find care for his cat yesterday after he committed suicide. He to me he was doing things around the house so that his family would have less to do once he is gone.  He states he is not actively suicidal here. No homicidal ideation  Patient is a 64 y.o. male presenting with mental health disorder. The history is provided by the patient.  Mental Health Problem Presenting symptoms: suicidal thoughts and suicidal threats   Patient accompanied by:  Family member Degree of incapacity (severity):  Moderate Onset quality:  Gradual Timing:  Constant Progression:  Unchanged Chronicity:  Recurrent Context: stressful life event   Context: not noncompliant and not recent medication change   Relieved by:  Nothing Worsened by:  Nothing tried Ineffective treatments:  None tried Associated symptoms: no abdominal pain and no anhedonia   Risk factors: hx of mental illness (depression) and hx of suicide attempts (has tried drug overdoses previously)     History reviewed. No pertinent past medical history. History reviewed. No pertinent past surgical history. History reviewed. No pertinent family history. History  Substance Use Topics  . Smoking status: Not on file  . Smokeless tobacco: Not on file  . Alcohol Use: Not on  file    Review of Systems  Constitutional: Negative for fever.  Respiratory: Negative for cough and shortness of breath.   Gastrointestinal: Negative for abdominal pain.  Psychiatric/Behavioral: Positive for suicidal ideas.  All other systems reviewed and are negative.     Allergies  Review of patient's allergies indicates no known allergies.  Home Medications   Prior to Admission medications   Not on File   BP 160/86 mmHg  Pulse 82  Temp(Src) 98.6 F (37 C) (Oral)  Resp 18  SpO2 97% Physical Exam  Constitutional: He is oriented to person, place, and time. He appears well-developed and well-nourished. No distress.  HENT:  Head: Normocephalic and atraumatic.  Mouth/Throat: No oropharyngeal exudate.  Eyes: EOM are normal. Pupils are equal, round, and reactive to light.  Neck: Normal range of motion. Neck supple.  Cardiovascular: Normal rate and regular rhythm.  Exam reveals no friction rub.   No murmur heard. Pulmonary/Chest: Effort normal and breath sounds normal. No respiratory distress. He has no wheezes. He has no rales.  Abdominal: Soft. He exhibits no distension. There is no tenderness. There is no rebound.  Musculoskeletal: Normal range of motion. He exhibits no edema.  Neurological: He is alert and oriented to person, place, and time. No cranial nerve deficit. He exhibits normal muscle tone. Coordination normal.  Skin: Skin is warm. No rash noted. He is not diaphoretic.  Nursing note and vitals reviewed.   ED Course  Procedures (including critical care time) Labs Review Labs Reviewed  COMPREHENSIVE METABOLIC PANEL - Abnormal; Notable for the following:    CO2 19 (*)  Glucose, Bld 321 (*)    BUN 25 (*)    Creatinine, Ser 2.21 (*)    GFR calc non Af Amer 30 (*)    GFR calc Af Amer 34 (*)    All other components within normal limits  ACETAMINOPHEN LEVEL - Abnormal; Notable for the following:    Acetaminophen (Tylenol), Serum <10 (*)    All other  components within normal limits  BASIC METABOLIC PANEL - Abnormal; Notable for the following:    Glucose, Bld 245 (*)    BUN 21 (*)    Creatinine, Ser 2.06 (*)    Calcium 8.7 (*)    GFR calc non Af Amer 32 (*)    GFR calc Af Amer 37 (*)    All other components within normal limits  CBG MONITORING, ED - Abnormal; Notable for the following:    Glucose-Capillary 211 (*)    All other components within normal limits  ETHANOL  SALICYLATE LEVEL  CBC  URINE RAPID DRUG SCREEN, HOSP PERFORMED    Imaging Review No results found.   EKG Interpretation None      MDM   Final diagnoses:  Suicidal behavior    64 year old male here for concern of him planning a suicide attempt. He denies a suicidal ideation now, but has been making plans for after his death. He also states he had a plan to take drugs he has at home like beta blockers. History of suicide attempts before. I feel patient warrants a TTS consult.  Creatinine mildly elevated at 2.21. After fluids, trended down to 2.06. Hx of kidney issues. I saw previous results of 1.78 four years ago. He's not too far off baseline. I do not feel this precludes him from a Psychiatric admission.  Evelina Bucy, MD 08/27/14 [phone]

## 2014-08-27 NOTE — ED Notes (Signed)
Pt states he is suicidal with a plan to use his blood pressure medication. Pt's sister at bedside states pt has been treated at the [place] in [place] for similar complaint. Pt denies HI. Pt is pleasant and cooperative. Pt also has an abrasion to his left leg, unknown how pt got the abrasion. Pt states the abrasion itches and sometimes can't control himself when it starts itching.

## 2014-08-27 NOTE — Progress Notes (Addendum)
21: 00  Pt under review at [facility].  23:20   No appropriate beds at [facility].

## 2014-08-27 NOTE — ED Notes (Signed)
Sitter at bedside.
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
## 2014-08-28 ENCOUNTER — Encounter (HOSPITAL_COMMUNITY): Payer: Self-pay | Admitting: Physical Medicine and Rehabilitation

## 2014-08-28 LAB — CBG MONITORING, ED
GLUCOSE-CAPILLARY: 186 mg/dL — AB (ref 65–99)
Glucose-Capillary: 261 mg/dL — ABNORMAL HIGH (ref 65–99)
Glucose-Capillary: 218 mg/dL — ABNORMAL HIGH (ref 65–99)

## 2014-08-28 NOTE — ED Notes (Signed)
Report given to Dyann Ruddle, RN at [facility] called to transport.

## 2014-08-28 NOTE — Progress Notes (Signed)
Pt accepted to [facility] by Dr. Lucilla Edin per Cedric in intake. Report [phone]. Admission is voluntary.  Attempted reaching pt's family to inform them of pending tranfer- unsuccessful.  Sharren Bridge, MSW, [place] Clinical Social Work, Disposition  08/28/2014 [phone]

## 2014-08-28 NOTE — ED Notes (Signed)
Pt accepted at [facility], Dr. Lucilla Edin accepting physician.

## 2014-08-28 NOTE — ED Notes (Signed)
No belongings found for patient.

## 2014-08-28 NOTE — Progress Notes (Addendum)
Seeking inpt placement for pt. (Also considered for [facility] upon bed availability)  Referred to: [facility]- per Archie Patten- per [facility]- per Sherry Ruffing- per Diamantina Monks- per [facility]- per Marlou Sa  Left voicemail with transfer coordinator for [facility] as pt reportedly has outpt provider there. Will refer if there is bed availability. Also contacted [place], [place], and [place] V[facility] and left voicemails at each.  Sharren Bridge, MSW, [place] Clinical Social Work, Disposition  08/28/2014 [phone]

## 2014-08-28 NOTE — ED Notes (Signed)
Pt resting quietly at the time. Sitter remains at bedside. No signs of distress noted.

## 2014-08-28 NOTE — ED Notes (Signed)
Pt up to bathroom, sitter at bedside. No signs of distress noted. Plan of care discussed with patient.

## 2014-08-28 NOTE — ED Notes (Signed)
Patient eating Lunch.

## 2015-04-15 DIAGNOSIS — W19XXXA Unspecified fall, initial encounter: Secondary | ICD-10-CM | POA: Diagnosis not present

## 2015-04-15 DIAGNOSIS — R42 Dizziness and giddiness: Secondary | ICD-10-CM | POA: Diagnosis not present

## 2015-04-15 DIAGNOSIS — S42294A Other nondisplaced fracture of upper end of right humerus, initial encounter for closed fracture: Secondary | ICD-10-CM | POA: Diagnosis not present

## 2015-04-15 DIAGNOSIS — S0081XA Abrasion of other part of head, initial encounter: Secondary | ICD-10-CM | POA: Diagnosis not present

## 2015-04-15 DIAGNOSIS — E039 Hypothyroidism, unspecified: Secondary | ICD-10-CM | POA: Diagnosis not present

## 2015-04-15 DIAGNOSIS — E7800 Pure hypercholesterolemia, unspecified: Secondary | ICD-10-CM | POA: Diagnosis not present

## 2015-04-15 DIAGNOSIS — S4991XA Unspecified injury of right shoulder and upper arm, initial encounter: Secondary | ICD-10-CM | POA: Diagnosis not present

## 2015-04-15 DIAGNOSIS — R51 Headache: Secondary | ICD-10-CM | POA: Diagnosis not present

## 2015-04-15 DIAGNOSIS — Z79899 Other long term (current) drug therapy: Secondary | ICD-10-CM | POA: Diagnosis not present

## 2015-04-15 DIAGNOSIS — I10 Essential (primary) hypertension: Secondary | ICD-10-CM | POA: Diagnosis not present

## 2015-04-15 DIAGNOSIS — R031 Nonspecific low blood-pressure reading: Secondary | ICD-10-CM | POA: Diagnosis not present

## 2015-04-15 DIAGNOSIS — E119 Type 2 diabetes mellitus without complications: Secondary | ICD-10-CM | POA: Diagnosis not present

## 2015-04-15 DIAGNOSIS — R110 Nausea: Secondary | ICD-10-CM | POA: Diagnosis not present

## 2015-04-15 DIAGNOSIS — S0990XA Unspecified injury of head, initial encounter: Secondary | ICD-10-CM | POA: Diagnosis not present

## 2015-04-15 DIAGNOSIS — Z7982 Long term (current) use of aspirin: Secondary | ICD-10-CM | POA: Diagnosis not present

## 2015-04-15 DIAGNOSIS — Z794 Long term (current) use of insulin: Secondary | ICD-10-CM | POA: Diagnosis not present

## 2015-04-15 DIAGNOSIS — S42221A 2-part displaced fracture of surgical neck of right humerus, initial encounter for closed fracture: Secondary | ICD-10-CM | POA: Diagnosis not present

## 2015-04-15 DIAGNOSIS — F17290 Nicotine dependence, other tobacco product, uncomplicated: Secondary | ICD-10-CM | POA: Diagnosis not present

## 2015-04-15 DIAGNOSIS — R55 Syncope and collapse: Secondary | ICD-10-CM | POA: Diagnosis not present

## 2015-04-22 DIAGNOSIS — S42294A Other nondisplaced fracture of upper end of right humerus, initial encounter for closed fracture: Secondary | ICD-10-CM | POA: Diagnosis not present

## 2015-04-30 DIAGNOSIS — S42294D Other nondisplaced fracture of upper end of right humerus, subsequent encounter for fracture with routine healing: Secondary | ICD-10-CM | POA: Diagnosis not present

## 2015-06-02 DIAGNOSIS — S42294D Other nondisplaced fracture of upper end of right humerus, subsequent encounter for fracture with routine healing: Secondary | ICD-10-CM | POA: Diagnosis not present

## 2015-06-14 DIAGNOSIS — S42294D Other nondisplaced fracture of upper end of right humerus, subsequent encounter for fracture with routine healing: Secondary | ICD-10-CM | POA: Diagnosis not present

## 2015-06-21 DIAGNOSIS — S42294D Other nondisplaced fracture of upper end of right humerus, subsequent encounter for fracture with routine healing: Secondary | ICD-10-CM | POA: Diagnosis not present

## 2015-06-30 DIAGNOSIS — S42294D Other nondisplaced fracture of upper end of right humerus, subsequent encounter for fracture with routine healing: Secondary | ICD-10-CM | POA: Diagnosis not present

## 2018-07-26 ENCOUNTER — Emergency Department (HOSPITAL_COMMUNITY): Payer: Medicare Other

## 2018-07-26 ENCOUNTER — Encounter (HOSPITAL_COMMUNITY): Payer: Self-pay | Admitting: *Deleted

## 2018-07-26 ENCOUNTER — Other Ambulatory Visit: Payer: Self-pay

## 2018-07-26 ENCOUNTER — Inpatient Hospital Stay (HOSPITAL_COMMUNITY)
Admission: EM | Admit: 2018-07-26 | Discharge: 2018-07-28 | DRG: 083 | Disposition: A | Discharge status: Home / Self Care | Payer: Medicare Other | Attending: Internal Medicine | Admitting: Internal Medicine

## 2018-07-26 DIAGNOSIS — N183 Chronic kidney disease, stage 3 (moderate): Secondary | ICD-10-CM | POA: Diagnosis present

## 2018-07-26 DIAGNOSIS — I6203 Nontraumatic chronic subdural hemorrhage: Secondary | ICD-10-CM | POA: Diagnosis present

## 2018-07-26 DIAGNOSIS — W010XXA Fall on same level from slipping, tripping and stumbling without subsequent striking against object, initial encounter: Secondary | ICD-10-CM | POA: Diagnosis present

## 2018-07-26 DIAGNOSIS — E1122 Type 2 diabetes mellitus with diabetic chronic kidney disease: Secondary | ICD-10-CM | POA: Diagnosis present

## 2018-07-26 DIAGNOSIS — E160 Drug-induced hypoglycemia without coma: Secondary | ICD-10-CM

## 2018-07-26 DIAGNOSIS — Z7982 Long term (current) use of aspirin: Secondary | ICD-10-CM

## 2018-07-26 DIAGNOSIS — E11649 Type 2 diabetes mellitus with hypoglycemia without coma: Secondary | ICD-10-CM | POA: Diagnosis present

## 2018-07-26 DIAGNOSIS — R4781 Slurred speech: Secondary | ICD-10-CM | POA: Diagnosis not present

## 2018-07-26 DIAGNOSIS — E785 Hyperlipidemia, unspecified: Secondary | ICD-10-CM | POA: Diagnosis present

## 2018-07-26 DIAGNOSIS — R569 Unspecified convulsions: Secondary | ICD-10-CM | POA: Diagnosis not present

## 2018-07-26 DIAGNOSIS — Z1159 Encounter for screening for other viral diseases: Secondary | ICD-10-CM

## 2018-07-26 DIAGNOSIS — I6521 Occlusion and stenosis of right carotid artery: Secondary | ICD-10-CM | POA: Diagnosis present

## 2018-07-26 DIAGNOSIS — F419 Anxiety disorder, unspecified: Secondary | ICD-10-CM | POA: Diagnosis present

## 2018-07-26 DIAGNOSIS — Z79890 Hormone replacement therapy (postmenopausal): Secondary | ICD-10-CM

## 2018-07-26 DIAGNOSIS — E119 Type 2 diabetes mellitus without complications: Secondary | ICD-10-CM

## 2018-07-26 DIAGNOSIS — G960 Cerebrospinal fluid leak: Secondary | ICD-10-CM | POA: Diagnosis present

## 2018-07-26 DIAGNOSIS — R4701 Aphasia: Secondary | ICD-10-CM | POA: Diagnosis present

## 2018-07-26 DIAGNOSIS — I129 Hypertensive chronic kidney disease with stage 1 through stage 4 chronic kidney disease, or unspecified chronic kidney disease: Secondary | ICD-10-CM | POA: Diagnosis present

## 2018-07-26 DIAGNOSIS — Z66 Do not resuscitate: Secondary | ICD-10-CM | POA: Diagnosis present

## 2018-07-26 DIAGNOSIS — I6529 Occlusion and stenosis of unspecified carotid artery: Secondary | ICD-10-CM

## 2018-07-26 DIAGNOSIS — T383X5A Adverse effect of insulin and oral hypoglycemic [antidiabetic] drugs, initial encounter: Secondary | ICD-10-CM

## 2018-07-26 DIAGNOSIS — S065X9A Traumatic subdural hemorrhage with loss of consciousness of unspecified duration, initial encounter: Secondary | ICD-10-CM | POA: Diagnosis not present

## 2018-07-26 DIAGNOSIS — R471 Dysarthria and anarthria: Secondary | ICD-10-CM | POA: Diagnosis present

## 2018-07-26 DIAGNOSIS — S065XAA Traumatic subdural hemorrhage with loss of consciousness status unknown, initial encounter: Secondary | ICD-10-CM

## 2018-07-26 DIAGNOSIS — Z79899 Other long term (current) drug therapy: Secondary | ICD-10-CM

## 2018-07-26 DIAGNOSIS — R29703 NIHSS score 3: Secondary | ICD-10-CM | POA: Diagnosis present

## 2018-07-26 DIAGNOSIS — G40409 Other generalized epilepsy and epileptic syndromes, not intractable, without status epilepticus: Secondary | ICD-10-CM | POA: Diagnosis present

## 2018-07-26 DIAGNOSIS — Z794 Long term (current) use of insulin: Secondary | ICD-10-CM

## 2018-07-26 DIAGNOSIS — X58XXXA Exposure to other specified factors, initial encounter: Secondary | ICD-10-CM | POA: Diagnosis present

## 2018-07-26 DIAGNOSIS — H1132 Conjunctival hemorrhage, left eye: Secondary | ICD-10-CM | POA: Diagnosis present

## 2018-07-26 DIAGNOSIS — S0081XA Abrasion of other part of head, initial encounter: Secondary | ICD-10-CM | POA: Diagnosis present

## 2018-07-26 DIAGNOSIS — R29810 Facial weakness: Secondary | ICD-10-CM | POA: Diagnosis present

## 2018-07-26 DIAGNOSIS — I10 Essential (primary) hypertension: Secondary | ICD-10-CM

## 2018-07-26 HISTORY — DX: Traumatic subdural hemorrhage with loss of consciousness of unspecified duration, initial encounter: S06.5X9A

## 2018-07-26 HISTORY — DX: Traumatic subdural hemorrhage with loss of consciousness status unknown, initial encounter: S06.5XAA

## 2018-07-26 LAB — COMPREHENSIVE METABOLIC PANEL
ALT: 25 U/L (ref 0–44)
AST: 26 U/L (ref 15–41)
Albumin: 4.4 g/dL (ref 3.5–5.0)
Alkaline Phosphatase: 37 U/L — ABNORMAL LOW (ref 38–126)
Anion gap: 9 (ref 5–15)
BUN: 37 mg/dL — ABNORMAL HIGH (ref 8–23)
CO2: 23 mmol/L (ref 22–32)
Calcium: 9.9 mg/dL (ref 8.9–10.3)
Chloride: 109 mmol/L (ref 98–111)
Creatinine, Ser: 2.37 mg/dL — ABNORMAL HIGH (ref 0.61–1.24)
GFR calc Af Amer: 31 mL/min — ABNORMAL LOW (ref >60)
GFR calc non Af Amer: 27 mL/min — ABNORMAL LOW (ref >60)
Glucose, Bld: 75 mg/dL (ref 70–99)
Potassium: 4.5 mmol/L (ref 3.5–5.1)
Sodium: 141 mmol/L (ref 135–145)
Total Bilirubin: 0.8 mg/dL (ref 0.3–1.2)
Total Protein: 7.1 g/dL (ref 6.5–8.1)

## 2018-07-26 LAB — CBC
HCT: 48 % (ref 39.0–52.0)
Hemoglobin: 15.8 g/dL (ref 13.0–17.0)
MCH: 30.8 pg (ref 26.0–34.0)
MCHC: 32.9 g/dL (ref 30.0–36.0)
MCV: 93.6 fL (ref 80.0–100.0)
Platelets: 240 10*3/uL (ref 150–400)
RBC: 5.13 MIL/uL (ref 4.22–5.81)
RDW: 12.7 % (ref 11.5–15.5)
WBC: 11.3 10*3/uL — ABNORMAL HIGH (ref 4.0–10.5)
nRBC: 0 % (ref 0.0–0.2)

## 2018-07-26 LAB — DIFFERENTIAL
Abs Immature Granulocytes: 0.03 10*3/uL (ref 0.00–0.07)
Basophils Absolute: 0.1 10*3/uL (ref 0.0–0.1)
Basophils Relative: 0 %
Eosinophils Absolute: 0.2 10*3/uL (ref 0.0–0.5)
Eosinophils Relative: 2 %
Immature Granulocytes: 0 %
Lymphocytes Relative: 48 %
Lymphs Abs: 5.4 10*3/uL — ABNORMAL HIGH (ref 0.7–4.0)
Monocytes Absolute: 0.8 10*3/uL (ref 0.1–1.0)
Monocytes Relative: 7 %
Neutro Abs: 4.8 10*3/uL (ref 1.7–7.7)
Neutrophils Relative %: 43 %

## 2018-07-26 LAB — PROTIME-INR
INR: 1 (ref 0.8–1.2)
Prothrombin Time: 13.2 seconds (ref 11.4–15.2)

## 2018-07-26 LAB — I-STAT CHEM 8, ED
BUN: 38 mg/dL — ABNORMAL HIGH (ref 8–23)
Calcium, Ion: 1.19 mmol/L (ref 1.15–1.40)
Chloride: 107 mmol/L (ref 98–111)
Creatinine, Ser: 2.3 mg/dL — ABNORMAL HIGH (ref 0.61–1.24)
Glucose, Bld: 75 mg/dL (ref 70–99)
HCT: 48 % (ref 39.0–52.0)
Hemoglobin: 16.3 g/dL (ref 13.0–17.0)
Potassium: 4.4 mmol/L (ref 3.5–5.1)
Sodium: 143 mmol/L (ref 135–145)
TCO2: 20 mmol/L — ABNORMAL LOW (ref 22–32)

## 2018-07-26 LAB — CBG MONITORING, ED: Glucose-Capillary: 60 mg/dL — ABNORMAL LOW (ref 70–99)

## 2018-07-26 LAB — GLUCOSE, CAPILLARY
Glucose-Capillary: 69 mg/dL — ABNORMAL LOW (ref 70–99)
Glucose-Capillary: 214 mg/dL — ABNORMAL HIGH (ref 70–99)

## 2018-07-26 LAB — SARS CORONAVIRUS 2: SARS Coronavirus 2: NOT DETECTED

## 2018-07-26 LAB — APTT: aPTT: 27 seconds (ref 24–36)

## 2018-07-26 MED ORDER — ACETAMINOPHEN 325 MG PO TABS
650.0000 mg | ORAL_TABLET | Freq: Once | ORAL | Status: AC
  Administered 2018-07-26: 650 mg via ORAL
  Filled 2018-07-26: qty 2

## 2018-07-26 MED ORDER — DEXTROSE 50 % IV SOLN
INTRAVENOUS | Status: AC
  Administered 2018-07-26: 50 mL
  Filled 2018-07-26: qty 50

## 2018-07-26 MED ORDER — SODIUM CHLORIDE 0.9% FLUSH: 3.0000 mL | Freq: Once | INTRAVENOUS | Status: DC

## 2018-07-26 MED ORDER — IOHEXOL 350 MG/ML SOLN
75.0000 mL | Freq: Once | INTRAVENOUS | Status: AC | PRN
  Administered 2018-07-26: 17:00:00 75 mL via INTRAVENOUS

## 2018-07-26 MED ORDER — ACETAMINOPHEN 650 MG RE SUPP: 650.0000 mg | Freq: Four times a day (QID) | RECTAL | Status: DC | PRN

## 2018-07-26 MED ORDER — ACETAMINOPHEN 325 MG PO TABS
650.0000 mg | ORAL_TABLET | Freq: Four times a day (QID) | ORAL | Status: DC | PRN
  Administered 2018-07-27: 650 mg via ORAL
  Filled 2018-07-26: qty 2

## 2018-07-26 MED ORDER — HYDRALAZINE HCL 20 MG/ML IJ SOLN
5.0000 mg | INTRAMUSCULAR | Status: DC | PRN
  Administered 2018-07-26: 23:00:00 5 mg via INTRAVENOUS
  Filled 2018-07-26: qty 1

## 2018-07-26 MED ORDER — DEXTROSE 50 % IV SOLN
1.0000 | Freq: Once | INTRAVENOUS | Status: DC
  Filled 2018-07-26: qty 50

## 2018-07-26 NOTE — ED Notes (Signed)
Given meds for a headache the headache is better

## 2018-07-26 NOTE — ED Provider Notes (Signed)
[facility] EMERGENCY DEPARTMENT Provider Note   CSN: [id number] Arrival date & time: 07/26/18  1644  An emergency department physician performed an initial assessment on this suspected stroke patient at [facility].  History   Chief Complaint Chief Complaint  Patient presents with   Code Stroke    HPI Johnny [name] is a 68 y.o. male.     HPI Patient presented the emergency room for evaluation of a possible stroke.  Patient has a history of diabetes, hyperlipidemia, and hypertension.  Last time known normal was today at about 3:30 PM.  Patient presented with left-sided weakness some gait unsteadiness and questionable left-sided facial droop.  Patient was immediately seen on arrival by Dr. Rory Percy, neurology.  Patient did have a fall yesterday.  He did develop some left-sided facial bruising.  Patient denies any trouble with chest pain or shortness of breath.  No fevers or chills. Past Medical History:  Diagnosis Date   Diabetes mellitus without complication    Hyperlipemia    Hypertension     There are no active problems to display for this patient.   No past surgical history on file.      Home Medications    Prior to Admission medications   Medication Sig Start Date End Date Taking? Authorizing Provider  aspirin EC 81 MG tablet Take 81 mg by mouth daily.    [provider]  atenolol (TENORMIN) 25 MG tablet Take 25 mg by mouth 2 (two) times daily.    [provider]  atorvastatin (LIPITOR) 80 MG tablet Take 40 mg by mouth daily.    [provider]  Cholecalciferol (VITAMIN D) 2000 UNITS CAPS Take 2,000 Units by mouth daily.    [provider]  CINNAMON PO Take 1 capsule by mouth daily.    [provider]  FLUoxetine (PROZAC) 20 MG tablet Take 20 mg by mouth daily with breakfast.     [provider]  glucose blood (PRECISION XTRA TEST STRIPS) test strip 1 each by Other route as needed (blood glucose  level). Use as instructed    [provider]  insulin aspart (NOVOLOG) 100 UNIT/ML injection Inject 10-25 Units into the skin 3 (three) times daily.    [provider]  insulin glargine (LANTUS) 100 UNIT/ML injection Inject 50 Units into the skin every morning.    [provider]  Insulin Syringe-Needle U-100 (INSULIN SYRINGE .5CC/30GX1/2") 30G X 1/2" 0.5 ML MISC Inject 1 Syringe into the skin 4 (four) times daily.    [provider]  levothyroxine (SYNTHROID, LEVOTHROID) 50 MCG tablet Take 50 mcg by mouth daily.    [provider]  lisinopril (PRINIVIL,ZESTRIL) 20 MG tablet Take 10 mg by mouth daily.    [provider]    Family History No family history on file.  Social History Social History   Tobacco Use   Smoking status: Never Smoker  Substance Use Topics   Alcohol use: No   Drug use: No     Allergies   Patient has no known allergies.   Review of Systems Review of Systems  All other systems reviewed and are negative.    Physical Exam Updated Vital Signs BP (!) 175/86    Pulse 75    Temp 98.9 F (37.2 C) (Temporal)    Resp 15    Ht 1.727 m (5\' 8" )    Wt 93.8 kg    SpO2 96%    BMI 31.44 kg/m   Physical  Exam Vitals signs and nursing note reviewed.  Constitutional:      General: He is not in acute distress.    Appearance: He is well-developed.  HENT:     Head: Normocephalic.     Comments: Oozing noted on the left side of the face    Right Ear: External ear normal.     Left Ear: External ear normal.  Eyes:     General: No scleral icterus.       Right eye: No discharge.        Left eye: No discharge.     Conjunctiva/sclera: Conjunctivae normal.  Neck:     Musculoskeletal: Neck supple.     Trachea: No tracheal deviation.  Cardiovascular:     Rate and Rhythm: Normal rate.  Pulmonary:     Effort: Pulmonary effort is normal. No respiratory distress.     Breath sounds: No stridor.  Abdominal:     General:  There is no distension.  Musculoskeletal:        General: No swelling or deformity.  Skin:    General: Skin is warm and dry.     Findings: No rash.  Neurological:     Mental Status: He is alert.     GCS: GCS eye subscore is 4. GCS verbal subscore is 5. GCS motor subscore is 6.     Cranial Nerves: No dysarthria. Cranial nerve deficit: ?left facial droop.     Comments: Neuro exam per neurology      ED Treatments / Results  Labs (all labs ordered are listed, but only abnormal results are displayed) Labs Reviewed  CBC - Abnormal; Notable for the following components:      Result Value   WBC 11.3 (*)    All other components within normal limits  DIFFERENTIAL - Abnormal; Notable for the following components:   Lymphs Abs 5.4 (*)    All other components within normal limits  COMPREHENSIVE METABOLIC PANEL - Abnormal; Notable for the following components:   BUN 37 (*)    Creatinine, Ser 2.37 (*)    Alkaline Phosphatase 37 (*)    GFR calc non Af Amer 27 (*)    GFR calc Af Amer 31 (*)    All other components within normal limits  I-STAT CHEM 8, ED - Abnormal; Notable for the following components:   BUN 38 (*)    Creatinine, Ser 2.30 (*)    TCO2 20 (*)    All other components within normal limits  CBG MONITORING, ED - Abnormal; Notable for the following components:   Glucose-Capillary 60 (*)    All other components within normal limits  SARS CORONAVIRUS 2  PROTIME-INR  APTT    EKG Normal sinus rhythm rate 76 Left atrial enlargement Normal axis normal intervals No prior EKG for comparison  Radiology Ct Angio Head W Or Wo Contrast  Result Date: 07/26/2018 CLINICAL DATA:  Unsteady gait. Slurred speech. EXAM: CT ANGIOGRAPHY HEAD AND NECK TECHNIQUE: Multidetector CT imaging of the head and neck was performed using the standard protocol during bolus administration of intravenous contrast. Multiplanar CT image reconstructions and MIPs were obtained to evaluate the vascular  anatomy. Carotid stenosis measurements (when applicable) are obtained utilizing NASCET criteria, using the distal internal carotid diameter as the denominator. CONTRAST:  76mL OMNIPAQUE IOHEXOL 350 MG/ML SOLN COMPARISON:  Code stroke CT head earlier today. FINDINGS: CTA NECK FINDINGS Aortic arch: Standard branching. Imaged portion shows no evidence of aneurysm or dissection. No significant stenosis of the  major arch vessel origins. Aortic atherosclerosis. Right carotid system: Severe proximal RIGHT ICA stenosis, luminal narrowing to 1 mm, estimated 75-90%, possibly greater. This is related to calcific and noncalcified plaque in the proximal RIGHT ICA. No dissection, although the cervical ICA on the RIGHT is decreased in caliber reflecting the high-grade proximal stenosis. Estimated 50-75% stenosis of the proximal ECA. Nonstenotic plaque mid common carotid artery. Left carotid system: No evidence of dissection, stenosis (50% or greater) or occlusion. Calcific and noncalcified plaque. Vertebral arteries: Codominant. No evidence of dissection, stenosis (50% or greater) or occlusion. Skeleton: Reported separately. Other neck: No masses. Upper chest: No pneumothorax or nodule. Review of the MIP images confirms the above findings CTA HEAD FINDINGS Anterior circulation: Calcification of the cavernous internal carotid arteries consistent with cerebrovascular atherosclerotic disease. No signs of intracranial large vessel occlusion. Posterior circulation: No significant stenosis, proximal occlusion, aneurysm, or vascular malformation. Venous sinuses: As permitted by contrast timing, patent. Anatomic variants: None of significance Delayed phase: Not performed. Review of the MIP images confirms the above findings IMPRESSION: 1. 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 Vascular surgical consultation is warranted, given the reduced caliber of the cervical ICA. 2. Non stenotic  atheromatous change LEFT carotid bifurcation. 3. No intracranial large vessel occlusion. 4. See additional significant findings noncontrast CT scan related to subdural fluid collections and focal LEFT parietal subdural clot. 5.  Aortic Atherosclerosis (ICD10-I70.0). Electronically Signed   By: Staci Righter M.D.   On: 07/26/2018 17:36   Ct Angio Neck W Or Wo Contrast  Result Date: 07/26/2018 CLINICAL DATA:  Unsteady gait. Slurred speech. EXAM: CT ANGIOGRAPHY HEAD AND NECK TECHNIQUE: Multidetector CT imaging of the head and neck was performed using the standard protocol during bolus administration of intravenous contrast. Multiplanar CT image reconstructions and MIPs were obtained to evaluate the vascular anatomy. Carotid stenosis measurements (when applicable) are obtained utilizing NASCET criteria, using the distal internal carotid diameter as the denominator. CONTRAST:  71mL OMNIPAQUE IOHEXOL 350 MG/ML SOLN COMPARISON:  Code stroke CT head earlier today. FINDINGS: CTA NECK FINDINGS Aortic arch: Standard branching. Imaged portion shows no evidence of aneurysm or dissection. No significant stenosis of the major arch vessel origins. Aortic atherosclerosis. Right carotid system: Severe proximal RIGHT ICA stenosis, luminal narrowing to 1 mm, estimated 75-90%, possibly greater. This is related to calcific and noncalcified plaque in the proximal RIGHT ICA. No dissection, although the cervical ICA on the RIGHT is decreased in caliber reflecting the high-grade proximal stenosis. Estimated 50-75% stenosis of the proximal ECA. Nonstenotic plaque mid common carotid artery. Left carotid system: No evidence of dissection, stenosis (50% or greater) or occlusion. Calcific and noncalcified plaque. Vertebral arteries: Codominant. No evidence of dissection, stenosis (50% or greater) or occlusion. Skeleton: Reported separately. Other neck: No masses. Upper chest: No pneumothorax or nodule. Review of the MIP images confirms the  above findings CTA HEAD FINDINGS Anterior circulation: Calcification of the cavernous internal carotid arteries consistent with cerebrovascular atherosclerotic disease. No signs of intracranial large vessel occlusion. Posterior circulation: No significant stenosis, proximal occlusion, aneurysm, or vascular malformation. Venous sinuses: As permitted by contrast timing, patent. Anatomic variants: None of significance Delayed phase: Not performed. Review of the MIP images confirms the above findings IMPRESSION: 1. 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 Vascular surgical consultation is warranted, given the reduced caliber of the cervical ICA. 2. Non stenotic atheromatous change LEFT carotid bifurcation. 3. No intracranial large vessel occlusion. 4. See additional  significant findings noncontrast CT scan related to subdural fluid collections and focal LEFT parietal subdural clot. 5.  Aortic Atherosclerosis (ICD10-I70.0). Electronically Signed   By: Staci Righter M.D.   On: 07/26/2018 17:36   Mr Brain Wo Contrast  Result Date: 07/26/2018 CLINICAL DATA:  Initial evaluation for acute slurred speech, unsteady gait, left-sided facial droop, possible subdural hematoma. EXAM: MRI HEAD WITHOUT CONTRAST TECHNIQUE: Multiplanar, multiecho pulse sequences of the brain and surrounding structures were obtained without intravenous contrast. COMPARISON:  Prior CT and CTA from earlier same day. FINDINGS: Brain: Moderately advanced cerebral volume loss for age. Patchy T2/FLAIR hyperintensity within the periventricular and deep white matter both cerebral hemispheres most consistent with chronic small vessel ischemic disease, mild in nature. Superimposed remote lacunar infarct present at the right basal ganglia. Small chronic cortical and subcortical infarcts seen involving the anterior left frontal lobe (series 15, image 18). Associated chronic hemosiderin staining. No abnormal foci of  restricted diffusion to suggest acute or subacute ischemia. Gray-white matter differentiation maintained. No other areas of chronic cortical infarction. No mass lesion or midline shift. Ventricles normal in size without hydrocephalus. Bilateral extra-axial subdural collections again seen, measuring up to approximately 3 mm bilaterally. Collection on the right is largely chronic in appearance, and could reflect a chronic subdural hematoma versus hygroma. Scattered acute blood products present within the left extra-axial collection, with focus of thrombus measuring 2.4 cm overlying the left parietal convexity (series 16, image 35). Pituitary gland suprasellar region within normal limits. Midline structures intact and normal. Vascular: Major intracranial vascular flow voids are maintained. Skull and upper cervical spine: Craniocervical junction within normal limits. Upper cervical spine normal. Bone marrow signal intensity within normal limits. No scalp soft tissue abnormality. Sinuses/Orbits: Globes and orbital soft tissues within normal limits. Paranasal sinuses are largely clear. Small bilateral mastoid effusions, right slightly larger than left, of doubtful significance. Inner ear structures normal. Other: None. IMPRESSION: 1. No acute intracranial infarct identified. 2. Bilateral subdural collections, chronic in appearance on the right, either representing a chronic subdural hematoma or hygroma. Collection on the left is acute on chronic in appearance with scattered internal acute blood products, stable from prior CT. 3. Small chronic left frontal lobe infarct. 4. Underlying moderately advanced cerebral atrophy with mild chronic microvascular ischemic disease. Electronically Signed   By: Jeannine Boga M.D.   On: 07/26/2018 18:05   Ct C-spine No Charge  Result Date: 07/26/2018 CLINICAL DATA:  Slurred speech. Fall. Subdural fluid collections. EXAM: CT CERVICAL SPINE WITHOUT CONTRAST TECHNIQUE:  Multidetector CT imaging of the cervical spine was performed without intravenous contrast. Multiplanar CT image reconstructions were also generated. COMPARISON:  None. FINDINGS: Alignment: Physiologic. Skull base and vertebrae: No acute fracture. No primary bone lesion or focal pathologic process. Soft tissues and spinal canal: No prevertebral fluid or swelling. No visible canal hematoma. Disc levels: Intervertebral disc spaces are largely preserved. There is no stenosis or foraminal narrowing. Upper chest: Negative. Other: None IMPRESSION: Negative exam.  No cervical spine fracture or traumatic subluxation. Electronically Signed   By: Staci Righter M.D.   On: 07/26/2018 17:39   Ct Head Code Stroke Wo Contrast  Result Date: 07/26/2018 CLINICAL DATA:  Code stroke.  Slurred speech. Unsteady gait. EXAM: CT HEAD WITHOUT CONTRAST TECHNIQUE: Contiguous axial images were obtained from the base of the skull through the vertex without intravenous contrast. COMPARISON:  CTA head neck reported separately. FINDINGS: Brain: BILATERAL low attenuation extra-axial fluid collections, representing either subdural hygromas or chronic subdural hematomas,  15-16 mm thick, flattening of the cortical sulci. Additional acute subdural thrombus 12 x 17 mm, over the RIGHT anterior parietal lobe hyperattenuating. No parenchymal hemorrhage. Generalized atrophy. Hypoattenuation of white matter. No hydrocephalus, or visible acute infarct. Vascular: Calcification of the cavernous internal carotid arteries consistent with cerebrovascular atherosclerotic disease. No signs of intracranial large vessel occlusion. Skull: No skull fracture. Sinuses/Orbits: No acute findings. Other: None. ASPECTS [facility] Stroke Program Early CT Score) - Ganglionic level infarction (caudate, lentiform nuclei, internal capsule, insula, M1-M3 cortex): 7 - Supraganglionic infarction (M4-M6 cortex): 3 Total score (0-10 with 10 being normal): 10 IMPRESSION: 1. BILATERAL  low attenuation extra-axial fluid collections representing either subdural hygromas or chronic subdural hematomas. 2. ASPECTS is 10. 3. Acute thrombus, LEFT subdural space, 12 x 17 mm. Thrombolysis/tPA is contraindicated. 4. These results were called by telephone at the time of interpretation on 07/26/2018 at 5:07 pm to Dr. Rory Percy , who verbally acknowledged these results. Electronically Signed   By: Staci Righter M.D.   On: 07/26/2018 17:12    Procedures Procedures (including critical care time)  Medications Ordered in ED Medications  sodium chloride flush (NS) 0.9 % injection 3 mL (has no administration in time range)  iohexol (OMNIPAQUE) 350 MG/ML injection 75 mL (75 mLs Intravenous Contrast Given 07/26/18 1701)  acetaminophen (TYLENOL) tablet 650 mg (650 mg Oral Given 07/26/18 1824)     Initial Impression / Assessment and Plan / ED Course  I have reviewed the triage vital signs and the nursing notes.  Pertinent labs & imaging results that were available during my care of the patient were reviewed by me and considered in my medical decision making (see chart for details).      Patient presented with a possible acute stroke.  Patient CT scan shows evidence of subdural hygromas or chronic subdural hematomas.  He does have an acute thrombus in the left subdural space.  No indication for TPA.  D/w Dr Rory Percy, neurology will follow.  No indication for neurosurgery. Still with concern about possible stroke with his presentation and carotid artery stenosis.  Plan on admission.  Final Clinical Impressions(s) / ED Diagnoses   Final diagnoses:  Subdural hematoma ([place])      Dorie Rank, MD 07/26/18 1929

## 2018-07-26 NOTE — ED Notes (Signed)
Nurse Navigator communication:Per Primary RN, the family has been contacted by the EDP.

## 2018-07-26 NOTE — Consult Note (Signed)
Reason for Consult: Subdural hematoma Referring Physician: Emergency department  Johnny [name] is an 68 y.o. male.  HPI: 68 year old male s/p fall yesterday.  Patient was home today when he by report suffered [name] brief seizure again falling.  He presents to the ER without any significant current complaints.  He has some mild headache.  He has no symptoms of numbness, paresthesias or weakness.  He is remained hemodynamically stable.  Past Medical History:  Diagnosis Date  . Diabetes mellitus without complication   . Hyperlipemia   . Hypertension     No past surgical history on file.  No family history on file.  Social History:  reports that he has never smoked. He does not have any smokeless tobacco history on file. He reports that he does not drink alcohol or use drugs.  Allergies: No Known Allergies  Medications: I have reviewed the patient's current medications.  Results for orders placed or performed during the hospital encounter of 07/26/18 (from the past 48 hour(s))  Protime-INR     Status: None   Collection Time: 07/26/18  4:49 PM  Result Value Ref Range   Prothrombin Time 13.2 11.4 - 15.2 seconds   INR 1.0 0.8 - 1.2    Comment: (NOTE) INR goal varies based on device and disease states. Performed at [facility], [place] [address], [place], [place] [address]   APTT     Status: None   Collection Time: 07/26/18  4:49 PM  Result Value Ref Range   aPTT 27 24 - 36 seconds    Comment: Performed at [facility] [address], [place], [place] [address]  CBC     Status: Abnormal   Collection Time: 07/26/18  4:49 PM  Result Value Ref Range   WBC 11.3 (H) 4.0 - 10.5 K/uL   RBC 5.13 4.22 - 5.81 MIL/uL   Hemoglobin 15.8 13.0 - 17.0 g/dL   HCT 48.0 39.0 - 52.0 %   MCV 93.6 80.0 - 100.0 fL   MCH 30.8 26.0 - 34.0 pg   MCHC 32.9 30.0 - 36.0 g/dL   RDW 12.7 11.5 - 15.5 %   Platelets 240 150 - 400 K/uL   nRBC 0.0 0.0 - 0.2 %    Comment: Performed at [facility], [place] [address] [place], [place] [address]  Differential     Status: Abnormal   Collection Time: 07/26/18  4:49 PM  Result Value Ref Range   Neutrophils Relative % 43 %   Neutro Abs 4.8 1.7 - 7.7 K/uL   Lymphocytes Relative 48 %   Lymphs Abs 5.4 (H) 0.7 - 4.0 K/uL   Monocytes Relative 7 %   Monocytes Absolute 0.8 0.1 - 1.0 K/uL   Eosinophils Relative 2 %   Eosinophils Absolute 0.2 0.0 - 0.5 K/uL   Basophils Relative 0 %   Basophils Absolute 0.1 0.0 - 0.1 K/uL   Immature Granulocytes 0 %   Abs Immature Granulocytes 0.03 0.00 - 0.07 K/uL    Comment: Performed at [facility] [address], [place],  [address]  Comprehensive metabolic panel     Status: Abnormal   Collection Time: 07/26/18  4:49 PM  Result Value Ref Range   Sodium 141 135 - 145 mmol/L   Potassium 4.5 3.5 - 5.1 mmol/L   Chloride 109 98 - 111 mmol/L   CO2 23 22 - 32 mmol/L   Glucose, Bld 75 70 - 99 mg/dL  BUN 37 (H) 8 - 23 mg/dL   Creatinine, Ser 2.37 (H) 0.61 - 1.24 mg/dL   Calcium 9.9 8.9 - 10.3 mg/dL   Total Protein 7.1 6.5 - 8.1 g/dL   Albumin 4.4 3.5 - 5.0 g/dL   AST 26 15 - 41 U/L   ALT 25 0 - 44 U/L   Alkaline Phosphatase 37 (L) 38 - 126 U/L   Total Bilirubin 0.8 0.3 - 1.2 mg/dL   GFR calc non Af Amer 27 (L) >60 mL/min   GFR calc Af Amer 31 (L) >60 mL/min   Anion gap 9 5 - 15    Comment: Performed at [facility] [address], [place], [place] [address]  I-stat chem 8, ED     Status: Abnormal   Collection Time: 07/26/18  4:51 PM  Result Value Ref Range   Sodium 143 135 - 145 mmol/L   Potassium 4.4 3.5 - 5.1 mmol/L   Chloride 107 98 - 111 mmol/L   BUN 38 (H) 8 - 23 mg/dL   Creatinine, Ser 2.30 (H) 0.61 - 1.24 mg/dL   Glucose, Bld 75 70 - 99 mg/dL   Calcium, Ion 1.19 1.15 - 1.40 mmol/L   TCO2 20 (L) 22 - 32 mmol/L   Hemoglobin 16.3 13.0 - 17.0 g/dL   HCT 48.0 39.0 - 52.0 %  CBG monitoring, ED     Status: Abnormal   Collection Time: 07/26/18  6:04 PM  Result Value  Ref Range   Glucose-Capillary 60 (L) 70 - 99 mg/dL    Ct Angio Head W Or Wo Contrast  Result Date: 07/26/2018 CLINICAL DATA:  Unsteady gait. Slurred speech. EXAM: CT ANGIOGRAPHY HEAD AND NECK TECHNIQUE: Multidetector CT imaging of the head and neck was performed using the standard protocol during bolus administration of intravenous contrast. Multiplanar CT image reconstructions and MIPs were obtained to evaluate the vascular anatomy. Carotid stenosis measurements (when applicable) are obtained utilizing NASCET criteria, using the distal internal carotid diameter as the denominator. CONTRAST:  77mL OMNIPAQUE IOHEXOL 350 MG/ML SOLN COMPARISON:  Code stroke CT head earlier today. FINDINGS: CTA NECK FINDINGS Aortic arch: Standard branching. Imaged portion shows no evidence of aneurysm or dissection. No significant stenosis of the major arch vessel origins. Aortic atherosclerosis. Right carotid system: Severe proximal RIGHT ICA stenosis, luminal narrowing to 1 mm, estimated 75-90%, possibly greater. This is related to calcific and noncalcified plaque in the proximal RIGHT ICA. No dissection, although the cervical ICA on the RIGHT is decreased in caliber reflecting the high-grade proximal stenosis. Estimated 50-75% stenosis of the proximal ECA. Nonstenotic plaque mid common carotid artery. Left carotid system: No evidence of dissection, stenosis (50% or greater) or occlusion. Calcific and noncalcified plaque. Vertebral arteries: Codominant. No evidence of dissection, stenosis (50% or greater) or occlusion. Skeleton: Reported separately. Other neck: No masses. Upper chest: No pneumothorax or nodule. Review of the MIP images confirms the above findings CTA HEAD FINDINGS Anterior circulation: Calcification of the cavernous internal carotid arteries consistent with cerebrovascular atherosclerotic disease. No signs of intracranial large vessel occlusion. Posterior circulation: No significant stenosis, proximal occlusion,  aneurysm, or vascular malformation. Venous sinuses: As permitted by contrast timing, patent. Anatomic variants: None of significance Delayed phase: Not performed. Review of the MIP images confirms the above findings IMPRESSION: 1. 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 Vascular surgical consultation is warranted, given the reduced caliber of the cervical ICA. 2. Non stenotic atheromatous change LEFT carotid bifurcation. 3.  No intracranial large vessel occlusion. 4. See additional significant findings noncontrast CT scan related to subdural fluid collections and focal LEFT parietal subdural clot. 5.  Aortic Atherosclerosis (ICD10-I70.0). Electronically Signed   By: Staci Righter M.D.   On: 07/26/2018 17:36   Ct Angio Neck W Or Wo Contrast  Result Date: 07/26/2018 CLINICAL DATA:  Unsteady gait. Slurred speech. EXAM: CT ANGIOGRAPHY HEAD AND NECK TECHNIQUE: Multidetector CT imaging of the head and neck was performed using the standard protocol during bolus administration of intravenous contrast. Multiplanar CT image reconstructions and MIPs were obtained to evaluate the vascular anatomy. Carotid stenosis measurements (when applicable) are obtained utilizing NASCET criteria, using the distal internal carotid diameter as the denominator. CONTRAST:  41mL OMNIPAQUE IOHEXOL 350 MG/ML SOLN COMPARISON:  Code stroke CT head earlier today. FINDINGS: CTA NECK FINDINGS Aortic arch: Standard branching. Imaged portion shows no evidence of aneurysm or dissection. No significant stenosis of the major arch vessel origins. Aortic atherosclerosis. Right carotid system: Severe proximal RIGHT ICA stenosis, luminal narrowing to 1 mm, estimated 75-90%, possibly greater. This is related to calcific and noncalcified plaque in the proximal RIGHT ICA. No dissection, although the cervical ICA on the RIGHT is decreased in caliber reflecting the high-grade proximal stenosis. Estimated 50-75%  stenosis of the proximal ECA. Nonstenotic plaque mid common carotid artery. Left carotid system: No evidence of dissection, stenosis (50% or greater) or occlusion. Calcific and noncalcified plaque. Vertebral arteries: Codominant. No evidence of dissection, stenosis (50% or greater) or occlusion. Skeleton: Reported separately. Other neck: No masses. Upper chest: No pneumothorax or nodule. Review of the MIP images confirms the above findings CTA HEAD FINDINGS Anterior circulation: Calcification of the cavernous internal carotid arteries consistent with cerebrovascular atherosclerotic disease. No signs of intracranial large vessel occlusion. Posterior circulation: No significant stenosis, proximal occlusion, aneurysm, or vascular malformation. Venous sinuses: As permitted by contrast timing, patent. Anatomic variants: None of significance Delayed phase: Not performed. Review of the MIP images confirms the above findings IMPRESSION: 1. 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 Vascular surgical consultation is warranted, given the reduced caliber of the cervical ICA. 2. Non stenotic atheromatous change LEFT carotid bifurcation. 3. No intracranial large vessel occlusion. 4. See additional significant findings noncontrast CT scan related to subdural fluid collections and focal LEFT parietal subdural clot. 5.  Aortic Atherosclerosis (ICD10-I70.0). Electronically Signed   By: Staci Righter M.D.   On: 07/26/2018 17:36   Mr Brain Wo Contrast  Result Date: 07/26/2018 CLINICAL DATA:  Initial evaluation for acute slurred speech, unsteady gait, left-sided facial droop, possible subdural hematoma. EXAM: MRI HEAD WITHOUT CONTRAST TECHNIQUE: Multiplanar, multiecho pulse sequences of the brain and surrounding structures were obtained without intravenous contrast. COMPARISON:  Prior CT and CTA from earlier same day. FINDINGS: Brain: Moderately advanced cerebral volume loss for age.  Patchy T2/FLAIR hyperintensity within the periventricular and deep white matter both cerebral hemispheres most consistent with chronic small vessel ischemic disease, mild in nature. Superimposed remote lacunar infarct present at the right basal ganglia. Small chronic cortical and subcortical infarcts seen involving the anterior left frontal lobe (series 15, image 18). Associated chronic hemosiderin staining. No abnormal foci of restricted diffusion to suggest acute or subacute ischemia. Gray-white matter differentiation maintained. No other areas of chronic cortical infarction. No mass lesion or midline shift. Ventricles normal in size without hydrocephalus. Bilateral extra-axial subdural collections again seen, measuring up to approximately 3 mm bilaterally. Collection on the right is largely chronic in appearance, and  could reflect [name] chronic subdural hematoma versus hygroma. Scattered acute blood products present within the left extra-axial collection, with focus of thrombus measuring 2.4 cm overlying the left parietal convexity (series 16, image 35). Pituitary gland suprasellar region within normal limits. Midline structures intact and normal. Vascular: Major intracranial vascular flow voids are maintained. Skull and upper cervical spine: Craniocervical junction within normal limits. Upper cervical spine normal. Bone marrow signal intensity within normal limits. No scalp soft tissue abnormality. Sinuses/Orbits: Globes and orbital soft tissues within normal limits. Paranasal sinuses are largely clear. Small bilateral mastoid effusions, right slightly larger than left, of doubtful significance. Inner ear structures normal. Other: None. IMPRESSION: 1. No acute intracranial infarct identified. 2. Bilateral subdural collections, chronic in appearance on the right, either representing [name] chronic subdural hematoma or hygroma. Collection on the left is acute on chronic in appearance with scattered internal acute blood  products, stable from prior CT. 3. Small chronic left frontal lobe infarct. 4. Underlying moderately advanced cerebral atrophy with mild chronic microvascular ischemic disease. Electronically Signed   By: Jeannine Boga M.D.   On: 07/26/2018 18:05   Ct C-spine No Charge  Result Date: 07/26/2018 CLINICAL DATA:  Slurred speech. Fall. Subdural fluid collections. EXAM: CT CERVICAL SPINE WITHOUT CONTRAST TECHNIQUE: Multidetector CT imaging of the cervical spine was performed without intravenous contrast. Multiplanar CT image reconstructions were also generated. COMPARISON:  None. FINDINGS: Alignment: Physiologic. Skull base and vertebrae: No acute fracture. No primary bone lesion or focal pathologic process. Soft tissues and spinal canal: No prevertebral fluid or swelling. No visible canal hematoma. Disc levels: Intervertebral disc spaces are largely preserved. There is no stenosis or foraminal narrowing. Upper chest: Negative. Other: None IMPRESSION: Negative exam.  No cervical spine fracture or traumatic subluxation. Electronically Signed   By: Staci Righter M.D.   On: 07/26/2018 17:39   Ct Head Code Stroke Wo Contrast  Result Date: 07/26/2018 CLINICAL DATA:  Code stroke.  Slurred speech. Unsteady gait. EXAM: CT HEAD WITHOUT CONTRAST TECHNIQUE: Contiguous axial images were obtained from the base of the skull through the vertex without intravenous contrast. COMPARISON:  CTA head neck reported separately. FINDINGS: Brain: BILATERAL low attenuation extra-axial fluid collections, representing either subdural hygromas or chronic subdural hematomas, 15-16 mm thick, flattening of the cortical sulci. Additional acute subdural thrombus 12 x 17 mm, over the RIGHT anterior parietal lobe hyperattenuating. No parenchymal hemorrhage. Generalized atrophy. Hypoattenuation of white matter. No hydrocephalus, or visible acute infarct. Vascular: Calcification of the cavernous internal carotid arteries consistent with  cerebrovascular atherosclerotic disease. No signs of intracranial large vessel occlusion. Skull: No skull fracture. Sinuses/Orbits: No acute findings. Other: None. ASPECTS [facility] Stroke Program Early CT Score) - Ganglionic level infarction (caudate, lentiform nuclei, internal capsule, insula, M1-M3 cortex): 7 - Supraganglionic infarction (M4-M6 cortex): 3 Total score (0-10 with 10 being normal): 10 IMPRESSION: 1. BILATERAL low attenuation extra-axial fluid collections representing either subdural hygromas or chronic subdural hematomas. 2. ASPECTS is 10. 3. Acute thrombus, LEFT subdural space, 12 x 17 mm. Thrombolysis/tPA is contraindicated. 4. These results were called by telephone at the time of interpretation on 07/26/2018 at 5:07 pm to Dr. Rory Percy , who verbally acknowledged these results. Electronically Signed   By: Staci Righter M.D.   On: 07/26/2018 17:12    Pertinent items noted in HPI and remainder of comprehensive ROS otherwise negative. Blood pressure (!) 175/86, pulse 75, temperature 98.9 F (37.2 C), temperature source Temporal, resp. rate 15, height 5\' 8"  (1.727 m), weight 93.8 kg,  SpO2 96 %. Patient is awake and alert.  He is oriented and appropriate.  His speech is fluent.  His judgment insight appear intact.  Cranial nerve function normal bilateral.  Motor 5/5 bilaterally.  No pronator drift.  Sensory examination nonfocal.  Examination head ears eyes nose throat demonstrates some scattered bruising and abrasion around his right lateral orbit.  No evidence of bony abnormality.  No evidence of CSF leak or hemorrhage.  Neck supple.  Nontender.  Airway midline.  Chest and abdomen benign.  Extremities free from injury deformity.  Assessment/Plan: Chronic appearing bilateral subdural hygromas.  Minimal amount of hemorrhage in his left posterior parietal region.  No evidence of mass-effect.  Recommend admission for observation.  Follow-up head CT scan in morning.  Hold anticoagulation.  Johnny [name]  Johnny [name] 07/26/2018, 7:19 PM

## 2018-07-26 NOTE — ED Notes (Signed)
Report given to tu tu on 3w

## 2018-07-26 NOTE — ED Notes (Signed)
Pt denies pain anywhere moves all extremities  Good grips bi-laterally  Pt has been seen briefly by dr Trenton Gammon neurosurgery

## 2018-07-26 NOTE — Code Documentation (Signed)
Per patient he fell yesterday when he was trying to get out of the car quickly. He stated he hit his head on the frame of the car.  He states that he has had a headache since that time.  He has bruising on the left side of his face and some burst capillaries in his eye.  This afternoon family members noted he was slurring his words.  Per family he was his normal self at 1530 this afternoon.  Patient arrived via EMS at 1644.  Stat head CT and labs done upon arrival. Per Dr Rory Percy CT results with subdural hematoma and contusion.  CTA done, pt with history of right carotid stenosis.  NIHSS 3: left facial droop, dysarthria and mild aphasia.    MRI done.  Hand off with ED RN.

## 2018-07-26 NOTE — H&P (Signed)
History and Physical    [name] WUX:[id number] DOB: 01-06-1951 DOA: 07/26/2018  PCP: Default, Provider, MD Patient coming from: Home  Chief Complaint: Difficulty with speech  HPI: Johnny [name] is a 68 y.o. male with medical history significant of type 2 diabetes, hypertension, hyperlipidemia, CKD 3 presenting to the hospital for evaluation of difficulty with speech. Patient states yesterday while trying to get out of his car he somehow got entangled in his seatbelt and fell.  When he fell he hit the left side of his face on the car door and since then is having headaches.  No change in vision.  Denies loss of consciousness.  States today around 3 PM he was sitting down and reading, all of a sudden noticed that he could not speak.  This lasted about 20 minutes.  According to ED documentation, patient reported left-sided weakness.  However, when I asked him he denied any focal weakness, numbness, or paresthesias.  Denies any difficulty with walking.  States he felt jittery at that time and thought maybe his blood sugar was low.  He takes Lantus in the morning and sliding scale insulin with meals.  States he did not eat lunch today.  Denies any fevers, chills, chest pain, shortness of breath, cough, abdominal pain, nausea, vomiting, diarrhea, or dysuria.  No other complaints.  ED Course: Afebrile.  Not tachycardic.  Systolic blood pressure in the 170s.  White count 11.3.  INR 1.0.  Creatinine 2.3, close to baseline.  Blood glucose 60.  COVID-19 rapid test negative.   Head CT showing bilateral low-attenuation extra-axial fluid collections representing either subdural hygromas or chronic subdural hematomas.  Acute thrombus, left subdural space, 12 x 17 mm. CT angiogram showing high-grade stenosis of the proximal right ICA, estimated 75 to 90%, potentially greater.  No intracranial large vessel occlusion. CT C-spine negative for fracture or traumatic subluxation. Brain MRI showing no acute  intracranial infarct.  Bilateral subdural collections, chronic in appearance on the right, either representing a chronic subdural hematoma or hygroma.  Collection on the left is acute on chronic in appearance with scattered internal acute blood products, stable from prior CT. He was seen by neurology and code stroke canceled.  No stroke work-up recommended.  Recommendation was to obtain neurosurgical consultation.  Neurosurgery evaluated the patient and recommended admission for observation.   Review of Systems:  All systems reviewed and apart from history of presenting illness, are negative.  Past Medical History:  Diagnosis Date   Diabetes mellitus without complication ([place])    Hyperlipemia    Hypertension     History reviewed. No pertinent surgical history.   reports that he has never smoked. He has never used smokeless tobacco. He reports that he does not drink alcohol or use drugs.  No Known Allergies  History reviewed. No pertinent family history.  Prior to Admission medications   Medication Sig Start Date End Date Taking? Authorizing Provider  aspirin EC 81 MG tablet Take 81 mg by mouth daily.    [provider]  atenolol (TENORMIN) 25 MG tablet Take 25 mg by mouth 2 (two) times daily.    [provider]  atorvastatin (LIPITOR) 80 MG tablet Take 40 mg by mouth daily.    [provider]  Cholecalciferol (VITAMIN D) 2000 UNITS CAPS Take 2,000 Units by mouth daily.    [provider]  CINNAMON PO Take 1 capsule by mouth daily.    [provider]  FLUoxetine (PROZAC) 20 MG tablet  Take 20 mg by mouth daily with breakfast.     [provider]  glucose blood (PRECISION XTRA TEST STRIPS) test strip 1 each by Other route as needed (blood glucose level). Use as instructed    [provider]  insulin aspart (NOVOLOG) 100 UNIT/ML injection Inject 10-25 Units into the skin 3 (three) times daily.    [provider]    insulin glargine (LANTUS) 100 UNIT/ML injection Inject 50 Units into the skin every morning.    [provider]  Insulin Syringe-Needle U-100 (INSULIN SYRINGE .5CC/30GX1/2") 30G X 1/2" 0.5 ML MISC Inject 1 Syringe into the skin 4 (four) times daily.    [provider]  levothyroxine (SYNTHROID, LEVOTHROID) 50 MCG tablet Take 50 mcg by mouth daily.    [provider]  lisinopril (PRINIVIL,ZESTRIL) 20 MG tablet Take 10 mg by mouth daily.    [provider]    Physical Exam: Vitals:   07/26/18 2000 07/26/18 2048 07/27/18 0008 07/27/18 0404  BP: (!) 173/91 (!) 177/86 (!) 126/59 (!) 106/57  Pulse: 71 74 81 76  Resp: (!) 30 19 18 18   Temp:  98 F (36.7 C) 98.1 F (36.7 C) 98 F (36.7 C)  TempSrc:  Oral Oral Oral  SpO2: 97% 100% 95% 94%  Weight:      Height:        Physical Exam  Constitutional: He is oriented to person, place, and time. He appears well-developed and well-nourished. No distress.  HENT:  Head: Normocephalic.  Mouth/Throat: Oropharynx is clear and moist.  Left periorbital bruising Left eye petechial subconjunctival hemorrhage  Eyes: Pupils are equal, round, and reactive to light. EOM are normal.  Neck: Neck supple.  Cardiovascular: Normal rate, regular rhythm and intact distal pulses.  Pulmonary/Chest: Effort normal. No respiratory distress. He has no wheezes. He has no rales.  Abdominal: Soft. Bowel sounds are normal. He exhibits no distension. There is no abdominal tenderness. There is no guarding.  Musculoskeletal:        General: No edema.  Neurological: He is alert and oriented to person, place, and time.  Speech fluent Strength 5 out of 5 in bilateral upper and lower extremities. Sensation to light touch intact throughout.  Skin: Skin is warm and dry. He is not diaphoretic.     Labs on Admission: I have personally reviewed following labs and imaging studies  CBC: Recent Labs  Lab 07/26/18 1649 07/26/18 1651  07/27/18 0522  WBC 11.3*  --  8.2  NEUTROABS 4.8  --   --   HGB 15.8 16.3 15.0  HCT 48.0 48.0 43.6  MCV 93.6  --  92.0  PLT 240  --  829   Basic Metabolic Panel: Recent Labs  Lab 07/26/18 1649 07/26/18 1651  NA 141 143  K 4.5 4.4  CL 109 107  CO2 23  --   GLUCOSE 75 75  BUN 37* 38*  CREATININE 2.37* 2.30*  CALCIUM 9.9  --    GFR: Estimated Creatinine Clearance: 33.4 mL/min (A) (by C-G formula based on SCr of 2.3 mg/dL (H)). Liver Function Tests: Recent Labs  Lab 07/26/18 1649  AST 26  ALT 25  ALKPHOS 37*  BILITOT 0.8  PROT 7.1  ALBUMIN 4.4   No results for input(s): LIPASE, AMYLASE in the last 168 hours. No results for input(s): AMMONIA in the last 168 hours. Coagulation Profile: Recent Labs  Lab 07/26/18 1649  INR 1.0   Cardiac Enzymes: No results for input(s): CKTOTAL,  CKMB, CKMBINDEX, TROPONINI in the last 168 hours. BNP (last 3 results) No results for input(s): PROBNP in the last 8760 hours. HbA1C: No results for input(s): HGBA1C in the last 72 hours. CBG: Recent Labs  Lab 07/26/18 1804 07/26/18 2101 07/26/18 2154 07/27/18 0010 07/27/18 0403  GLUCAP 60* 69* 214* 170* 93   Lipid Profile: No results for input(s): CHOL, HDL, LDLCALC, TRIG, CHOLHDL, LDLDIRECT in the last 72 hours. Thyroid Function Tests: No results for input(s): TSH, T4TOTAL, FREET4, T3FREE, THYROIDAB in the last 72 hours. Anemia Panel: No results for input(s): VITAMINB12, FOLATE, FERRITIN, TIBC, IRON, RETICCTPCT in the last 72 hours. Urine analysis:    Component Value Date/Time   COLORURINE YELLOW 06/24/2008 2226   APPEARANCEUR CLEAR 06/24/2008 2226   LABSPEC 1.022 06/24/2008 2226   PHURINE 5.0 06/24/2008 2226   GLUCOSEU 250 (A) 06/24/2008 2226   HGBUR MODERATE (A) 06/24/2008 2226   BILIRUBINUR NEGATIVE 06/24/2008 2226   KETONESUR NEGATIVE 06/24/2008 2226   PROTEINUR 100 (A) 06/24/2008 2226   UROBILINOGEN 0.2 06/24/2008 2226   NITRITE NEGATIVE 06/24/2008 2226    LEUKOCYTESUR NEGATIVE 06/24/2008 2226    Radiological Exams on Admission: Ct Angio Head W Or Wo Contrast  Result Date: 07/26/2018 CLINICAL DATA:  Unsteady gait. Slurred speech. EXAM: CT ANGIOGRAPHY HEAD AND NECK TECHNIQUE: Multidetector CT imaging of the head and neck was performed using the standard protocol during bolus administration of intravenous contrast. Multiplanar CT image reconstructions and MIPs were obtained to evaluate the vascular anatomy. Carotid stenosis measurements (when applicable) are obtained utilizing NASCET criteria, using the distal internal carotid diameter as the denominator. CONTRAST:  22mL OMNIPAQUE IOHEXOL 350 MG/ML SOLN COMPARISON:  Code stroke CT head earlier today. FINDINGS: CTA NECK FINDINGS Aortic arch: Standard branching. Imaged portion shows no evidence of aneurysm or dissection. No significant stenosis of the major arch vessel origins. Aortic atherosclerosis. Right carotid system: Severe proximal RIGHT ICA stenosis, luminal narrowing to 1 mm, estimated 75-90%, possibly greater. This is related to calcific and noncalcified plaque in the proximal RIGHT ICA. No dissection, although the cervical ICA on the RIGHT is decreased in caliber reflecting the high-grade proximal stenosis. Estimated 50-75% stenosis of the proximal ECA. Nonstenotic plaque mid common carotid artery. Left carotid system: No evidence of dissection, stenosis (50% or greater) or occlusion. Calcific and noncalcified plaque. Vertebral arteries: Codominant. No evidence of dissection, stenosis (50% or greater) or occlusion. Skeleton: Reported separately. Other neck: No masses. Upper chest: No pneumothorax or nodule. Review of the MIP images confirms the above findings CTA HEAD FINDINGS Anterior circulation: Calcification of the cavernous internal carotid arteries consistent with cerebrovascular atherosclerotic disease. No signs of intracranial large vessel occlusion. Posterior circulation: No significant stenosis,  proximal occlusion, aneurysm, or vascular malformation. Venous sinuses: As permitted by contrast timing, patent. Anatomic variants: None of significance Delayed phase: Not performed. Review of the MIP images confirms the above findings IMPRESSION: 1. 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 Vascular surgical consultation is warranted, given the reduced caliber of the cervical ICA. 2. Non stenotic atheromatous change LEFT carotid bifurcation. 3. No intracranial large vessel occlusion. 4. See additional significant findings noncontrast CT scan related to subdural fluid collections and focal LEFT parietal subdural clot. 5.  Aortic Atherosclerosis (ICD10-I70.0). Electronically Signed   By: Staci Righter M.D.   On: 07/26/2018 17:36   Ct Angio Neck W Or Wo Contrast  Result Date: 07/26/2018 CLINICAL DATA:  Unsteady gait. Slurred speech. EXAM: CT ANGIOGRAPHY HEAD AND NECK TECHNIQUE:  Multidetector CT imaging of the head and neck was performed using the standard protocol during bolus administration of intravenous contrast. Multiplanar CT image reconstructions and MIPs were obtained to evaluate the vascular anatomy. Carotid stenosis measurements (when applicable) are obtained utilizing NASCET criteria, using the distal internal carotid diameter as the denominator. CONTRAST:  56mL OMNIPAQUE IOHEXOL 350 MG/ML SOLN COMPARISON:  Code stroke CT head earlier today. FINDINGS: CTA NECK FINDINGS Aortic arch: Standard branching. Imaged portion shows no evidence of aneurysm or dissection. No significant stenosis of the major arch vessel origins. Aortic atherosclerosis. Right carotid system: Severe proximal RIGHT ICA stenosis, luminal narrowing to 1 mm, estimated 75-90%, possibly greater. This is related to calcific and noncalcified plaque in the proximal RIGHT ICA. No dissection, although the cervical ICA on the RIGHT is decreased in caliber reflecting the high-grade proximal stenosis.  Estimated 50-75% stenosis of the proximal ECA. Nonstenotic plaque mid common carotid artery. Left carotid system: No evidence of dissection, stenosis (50% or greater) or occlusion. Calcific and noncalcified plaque. Vertebral arteries: Codominant. No evidence of dissection, stenosis (50% or greater) or occlusion. Skeleton: Reported separately. Other neck: No masses. Upper chest: No pneumothorax or nodule. Review of the MIP images confirms the above findings CTA HEAD FINDINGS Anterior circulation: Calcification of the cavernous internal carotid arteries consistent with cerebrovascular atherosclerotic disease. No signs of intracranial large vessel occlusion. Posterior circulation: No significant stenosis, proximal occlusion, aneurysm, or vascular malformation. Venous sinuses: As permitted by contrast timing, patent. Anatomic variants: None of significance Delayed phase: Not performed. Review of the MIP images confirms the above findings IMPRESSION: 1. 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 Vascular surgical consultation is warranted, given the reduced caliber of the cervical ICA. 2. Non stenotic atheromatous change LEFT carotid bifurcation. 3. No intracranial large vessel occlusion. 4. See additional significant findings noncontrast CT scan related to subdural fluid collections and focal LEFT parietal subdural clot. 5.  Aortic Atherosclerosis (ICD10-I70.0). Electronically Signed   By: Staci Righter M.D.   On: 07/26/2018 17:36   Mr Brain Wo Contrast  Result Date: 07/26/2018 CLINICAL DATA:  Initial evaluation for acute slurred speech, unsteady gait, left-sided facial droop, possible subdural hematoma. EXAM: MRI HEAD WITHOUT CONTRAST TECHNIQUE: Multiplanar, multiecho pulse sequences of the brain and surrounding structures were obtained without intravenous contrast. COMPARISON:  Prior CT and CTA from earlier same day. FINDINGS: Brain: Moderately advanced cerebral volume  loss for age. Patchy T2/FLAIR hyperintensity within the periventricular and deep white matter both cerebral hemispheres most consistent with chronic small vessel ischemic disease, mild in nature. Superimposed remote lacunar infarct present at the right basal ganglia. Small chronic cortical and subcortical infarcts seen involving the anterior left frontal lobe (series 15, image 18). Associated chronic hemosiderin staining. No abnormal foci of restricted diffusion to suggest acute or subacute ischemia. Gray-white matter differentiation maintained. No other areas of chronic cortical infarction. No mass lesion or midline shift. Ventricles normal in size without hydrocephalus. Bilateral extra-axial subdural collections again seen, measuring up to approximately 3 mm bilaterally. Collection on the right is largely chronic in appearance, and could reflect a chronic subdural hematoma versus hygroma. Scattered acute blood products present within the left extra-axial collection, with focus of thrombus measuring 2.4 cm overlying the left parietal convexity (series 16, image 35). Pituitary gland suprasellar region within normal limits. Midline structures intact and normal. Vascular: Major intracranial vascular flow voids are maintained. Skull and upper cervical spine: Craniocervical junction within normal limits. Upper cervical spine normal. Bone marrow  signal intensity within normal limits. No scalp soft tissue abnormality. Sinuses/Orbits: Globes and orbital soft tissues within normal limits. Paranasal sinuses are largely clear. Small bilateral mastoid effusions, right slightly larger than left, of doubtful significance. Inner ear structures normal. Other: None. IMPRESSION: 1. No acute intracranial infarct identified. 2. Bilateral subdural collections, chronic in appearance on the right, either representing a chronic subdural hematoma or hygroma. Collection on the left is acute on chronic in appearance with scattered internal  acute blood products, stable from prior CT. 3. Small chronic left frontal lobe infarct. 4. Underlying moderately advanced cerebral atrophy with mild chronic microvascular ischemic disease. Electronically Signed   By: Jeannine Boga M.D.   On: 07/26/2018 18:05   Ct C-spine No Charge  Result Date: 07/26/2018 CLINICAL DATA:  Slurred speech. Fall. Subdural fluid collections. EXAM: CT CERVICAL SPINE WITHOUT CONTRAST TECHNIQUE: Multidetector CT imaging of the cervical spine was performed without intravenous contrast. Multiplanar CT image reconstructions were also generated. COMPARISON:  None. FINDINGS: Alignment: Physiologic. Skull base and vertebrae: No acute fracture. No primary bone lesion or focal pathologic process. Soft tissues and spinal canal: No prevertebral fluid or swelling. No visible canal hematoma. Disc levels: Intervertebral disc spaces are largely preserved. There is no stenosis or foraminal narrowing. Upper chest: Negative. Other: None IMPRESSION: Negative exam.  No cervical spine fracture or traumatic subluxation. Electronically Signed   By: Staci Righter M.D.   On: 07/26/2018 17:39   Ct Head Code Stroke Wo Contrast  Result Date: 07/26/2018 CLINICAL DATA:  Code stroke.  Slurred speech. Unsteady gait. EXAM: CT HEAD WITHOUT CONTRAST TECHNIQUE: Contiguous axial images were obtained from the base of the skull through the vertex without intravenous contrast. COMPARISON:  CTA head neck reported separately. FINDINGS: Brain: BILATERAL low attenuation extra-axial fluid collections, representing either subdural hygromas or chronic subdural hematomas, 15-16 mm thick, flattening of the cortical sulci. Additional acute subdural thrombus 12 x 17 mm, over the RIGHT anterior parietal lobe hyperattenuating. No parenchymal hemorrhage. Generalized atrophy. Hypoattenuation of white matter. No hydrocephalus, or visible acute infarct. Vascular: Calcification of the cavernous internal carotid arteries consistent  with cerebrovascular atherosclerotic disease. No signs of intracranial large vessel occlusion. Skull: No skull fracture. Sinuses/Orbits: No acute findings. Other: None. ASPECTS [facility] Stroke Program Early CT Score) - Ganglionic level infarction (caudate, lentiform nuclei, internal capsule, insula, M1-M3 cortex): 7 - Supraganglionic infarction (M4-M6 cortex): 3 Total score (0-10 with 10 being normal): 10 IMPRESSION: 1. BILATERAL low attenuation extra-axial fluid collections representing either subdural hygromas or chronic subdural hematomas. 2. ASPECTS is 10. 3. Acute thrombus, LEFT subdural space, 12 x 17 mm. Thrombolysis/tPA is contraindicated. 4. These results were called by telephone at the time of interpretation on 07/26/2018 at 5:07 pm to Dr. Rory Percy , who verbally acknowledged these results. Electronically Signed   By: Staci Righter M.D.   On: 07/26/2018 17:12    EKG: Pending at this time.  Assessment/Plan Principal Problem:   Subdural hematoma (HCC) Active Problems:   Internal carotid artery stenosis   HTN (hypertension)   Hypoglycemia due to insulin   Type 2 diabetes mellitus (HCC)   Subdural hematomas Brain MRI showing bilateral subdural collections, chronic in appearance on the right, either representing a chronic subdural hematoma or hygroma.  Collection on the left is acute on chronic in appearance with scattered internal acute blood products, stable from prior CT. Currently no focal neuro deficit appreciated on exam. Patient was seen by neurosurgery and admission for observation was recommended. -Hold any antiplatelet agents  and/ or anticoagulation -Frequent neurochecks -Repeat head CT in a.m. -Keep systolic blood pressure less than 140  Right ICA stenosis CT angiogram showing high-grade stenosis of the proximal right ICA, estimated 75 to 90%, potentially greater.  No intracranial large vessel occlusion. -Consult vascular surgery in the morning   Hypertension Currently  normotensive. -Keep systolic blood pressure less than 140 -Hydralazine PRN  Hypoglycemia, insulin-dependent type 2 diabetes mellitus Glucose 60.  Patient reported taking his home insulin as scheduled but not eating lunch today.  D50 given and blood glucose now improved. -CBG every 2 hours -Hold insulin at this time -Check A1c  Mild leukocytosis, resolved Likely reactive.  Patient is afebrile.  White count mildly elevated at 11.3 on initial labs, repeat labs showing white count 8.2.  CKD 3 -Stable.  Creatinine 2.3, close to baseline.  DVT prophylaxis: SCDs Code Status: Patient wishes to be DNR. Family Communication: No family available. Disposition Plan: Anticipate discharge after clinical improvement. Consults called: Neurosurgery, neurology Admission status: It is my clinical opinion that referral for OBSERVATION is reasonable and necessary in this patient based on the above information provided. The aforementioned taken together are felt to place the patient at high risk for further clinical deterioration. However it is anticipated that the patient may be medically stable for discharge from the hospital within 24 to 48 hours.  The medical decision making on this patient was of high complexity and the patient is at high risk for clinical deterioration, therefore this is a level 3 visit.  Shela Leff MD [facility] Pager [phone]  If 7PM-7AM, please contact night-coverage [email or web address] Password [facility]  07/27/2018, 6:19 AM

## 2018-07-26 NOTE — ED Notes (Signed)
Pt to MRI at this time.

## 2018-07-26 NOTE — ED Triage Notes (Signed)
Pt presents to ED via [facility] with L sided facial droop, dysarthria, and aphasia. Pt reportedly fell yesterday. LKN 1527. EMS reports VSS en route to ED.

## 2018-07-26 NOTE — Consult Note (Addendum)
Neurology Consultation  Reason for Consult: code stroke Referring Physician: Dr. Marye Round  CC: Left-sided weakness, gait unsteadiness  History is obtained from: Chart, over the phone from patient's niece-Ms. Ann at [phone].  HPI: Johnny [name] is a 68 y.o. male patient at the [facility], history of diabetes hyperlipidemia hypertension, right carotid stenosis, who was in his usual state of health at about 3:30 PM today, when the family member noted that he is not able to talk normally and he was slurring his words. About 24 hours ago, he was attempting to get out of his car and slipped and fell and hit his left side of the head and face and bruised it pretty well along with some burst capillaries in his eye.  The family requested him to come to the hospital at that time but he refused. Today with the sudden change in his speech, EMS was called, they assessed him and found him to have some left facial droop, slurred speech and brought him in as an acute code stroke. He reports that he has been investigated for a right carotid stenosis at the [facility] but no intervention has been done yet. He denied any tingling numbness weakness at this time.  He denies any shortness of breath chest pain. He denies any fevers, cough or sick contact. He denies any abdominal pain nausea vomiting diarrhea constipation.   LKW: 3:30 PM today tpa given?: no, CT head with acute on chronic subdural as well as contusion and low stroke scale0 ROS: ROS was performed and is negative except as noted in the HPI.    Past Medical History:  Diagnosis Date  . Diabetes mellitus without complication   . Hyperlipemia   . Hypertension    No family history on file. No family history of stroke  Social History:   reports that he has never smoked. He does not have any smokeless tobacco history on file. He reports that he does not drink alcohol or use drugs.  Medications  Current Facility-Administered  Medications:  .  sodium chloride flush (NS) 0.9 % injection 3 mL, 3 mL, Intravenous, Once, Dorie Rank, MD  Current Outpatient Medications:  .  aspirin EC 81 MG tablet, Take 81 mg by mouth daily., Disp: , Rfl:  .  atenolol (TENORMIN) 25 MG tablet, Take 25 mg by mouth 2 (two) times daily., Disp: , Rfl:  .  atorvastatin (LIPITOR) 80 MG tablet, Take 40 mg by mouth daily., Disp: , Rfl:  .  Cholecalciferol (VITAMIN D) 2000 UNITS CAPS, Take 2,000 Units by mouth daily., Disp: , Rfl:  .  CINNAMON PO, Take 1 capsule by mouth daily., Disp: , Rfl:  .  FLUoxetine (PROZAC) 20 MG tablet, Take 20 mg by mouth daily with breakfast. , Disp: , Rfl:  .  glucose blood (PRECISION XTRA TEST STRIPS) test strip, 1 each by Other route as needed (blood glucose level). Use as instructed, Disp: , Rfl:  .  insulin aspart (NOVOLOG) 100 UNIT/ML injection, Inject 10-25 Units into the skin 3 (three) times daily., Disp: , Rfl:  .  insulin glargine (LANTUS) 100 UNIT/ML injection, Inject 50 Units into the skin every morning., Disp: , Rfl:  .  Insulin Syringe-Needle U-100 (INSULIN SYRINGE .5CC/30GX1/2") 30G X 1/2" 0.5 ML MISC, Inject 1 Syringe into the skin 4 (four) times daily., Disp: , Rfl:  .  levothyroxine (SYNTHROID, LEVOTHROID) 50 MCG tablet, Take 50 mcg by mouth daily., Disp: , Rfl:  .  lisinopril (PRINIVIL,ZESTRIL)  20 MG tablet, Take 10 mg by mouth daily., Disp: , Rfl:   Exam: Current vital signs: Wt 93.8 kg  Vital signs in last 24 hours: Weight:  [93.8 kg] 93.8 kg (06/19 1600) General: Awake alert in no distress HEENT: Left side of the face and cheek has bruises, there is petechial hemorrhage subconjunctival in the left eye. CVS: S1-S2 heard regular rate rhythm Respiratory: Breathing normally saturating normally on room air Abdomen: Nondistended nontender Extremities: Warm well perfused with no edema Neurological exam Patient is awake alert oriented x3 Speech is dysarthric No evidence of aphasia but he has mild  hesitancy in his speech. Cranial nerves: Pupils equal round reactive to light, extraocular movements intact, visual fields are full, mild left facial asymmetry due to left lower facial weakness, auditory acuity intact, tongue and palate midline. Motor exam: No drift in any of the extremities.  Nearly symmetric 5/5 strength all over. Sensory exam: Intact light touch with no extinction Cerebellar exam: No ataxia Gait- deferred NIH stroke scale-3  Labs I have reviewed labs in epic and the results pertinent to this consultation are:  CBC    Component Value Date/Time   WBC 7.6 08/27/2014 1726   RBC 5.08 08/27/2014 1726   HGB 16.3 07/26/2018 1651   HCT 48.0 07/26/2018 1651   PLT 267 08/27/2014 1726   MCV 86.8 08/27/2014 1726   MCH 30.1 08/27/2014 1726   MCHC 34.7 08/27/2014 1726   RDW 13.7 08/27/2014 1726   LYMPHSABS 1.2 06/24/2008 2245   MONOABS 0.6 06/24/2008 2245   EOSABS 0.1 06/24/2008 2245   BASOSABS 0.0 06/24/2008 2245    CMP     Component Value Date/Time   NA 143 07/26/2018 1651   K 4.4 07/26/2018 1651   CL 107 07/26/2018 1651   CO2 23 08/27/2014 2121   GLUCOSE 75 07/26/2018 1651   BUN 38 (H) 07/26/2018 1651   CREATININE 2.30 (H) 07/26/2018 1651   CALCIUM 8.7 (L) 08/27/2014 2121   PROT 7.6 08/27/2014 1726   ALBUMIN 4.4 08/27/2014 1726   AST 25 08/27/2014 1726   ALT 33 08/27/2014 1726   ALKPHOS 55 08/27/2014 1726   BILITOT 0.8 08/27/2014 1726   GFRNONAA 32 (L) 08/27/2014 2121   GFRAA 37 (L) 08/27/2014 2121    Imaging I have reviewed the images obtained:  CT-scan of the brain-bilateral chronic subdural hygromas and possible small petechial acute component on the left versus convexity contusion and very trace subarachnoid likely traumatic. CTA head and neck with a significant right ICA stenosis, no gross aneurysms identified.  Assessment: 68 year old man with past medical history of hypertension, diabetes, hyperlipidemia, right ICA stenosis presents for sudden  onset of slurred speech difficulty walking and left facial droop. On examination, he has a NIH stroke scale of 3 -mild aphasia, dysarthria and left facial droop. His CT scan of the head shows chronic subdural hygromas with small acute bleed and possible high convexity contusion on the left.  CT angios head and neck with significant right ICA stenosis. His exam does not look like an LVO and there is no intracranial LVO- only cervical right ICA stenosis but has flow past it.  Not a candidate for EVT. TPA is contraindicated due to the acute bleed on the subacute hygromas.  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  SWI images confirmed the acute small subdural bleed on the left. No ischemic stroke. Oobtain neurosurgical opinion on management of the bilateral subdural hygromas and the acute bleed on top of the chronic hygroma in the left high frontal convexity.  I am not sure if he is a surgical candidate because his exam looks very good for right now. Keep systolic blood pressures below 140. Do not use antiplatelets or anticoagulants. He has follow up for his RICA stenosis at [facility] - might want to consider vascular surgery consult while admitted. No need to pursue stroke work up as this presentation is likely secondary to the left-sided traumatic subdural and trace subarachnoid hemorrhage.  Neurology will be available as needed. Plan relayed to the EDP - Dr. Tomi Bamberger over the phone.  -- Amie Portland, MD [facility] Pager: [phone] If 7pm to 7am, please call on call as listed on AMION.  CRITICAL CARE ATTESTATION Performed by: Amie Portland, MD Total critical care time: 40 minutes Critical care time was exclusive  of separately billable procedures and treating other patients and/or supervising APPs/Residents/Students Critical care was necessary to treat or prevent imminent or life-threatening deterioration due to concern for stroke, traumatic SDH  This patient is critically ill and at significant risk for neurological worsening and/or death and care requires constant monitoring. Critical care was time spent personally by me on the following activities: development of treatment plan with patient and/or surrogate as well as nursing, discussions with consultants, evaluation of patient's response to treatment, examination of patient, obtaining history from patient or surrogate, ordering and performing treatments and interventions, ordering and review of laboratory studies, ordering and review of radiographic studies, pulse oximetry, re-evaluation of patient's condition, participation in multidisciplinary rounds and medical decision making of high complexity in the care of this patient.

## 2018-07-27 ENCOUNTER — Observation Stay (HOSPITAL_COMMUNITY): Payer: Medicare Other

## 2018-07-27 DIAGNOSIS — R29810 Facial weakness: Secondary | ICD-10-CM | POA: Diagnosis present

## 2018-07-27 DIAGNOSIS — I10 Essential (primary) hypertension: Secondary | ICD-10-CM

## 2018-07-27 DIAGNOSIS — Z79890 Hormone replacement therapy (postmenopausal): Secondary | ICD-10-CM | POA: Diagnosis not present

## 2018-07-27 DIAGNOSIS — R4701 Aphasia: Secondary | ICD-10-CM | POA: Diagnosis present

## 2018-07-27 DIAGNOSIS — E160 Drug-induced hypoglycemia without coma: Secondary | ICD-10-CM

## 2018-07-27 DIAGNOSIS — S0081XA Abrasion of other part of head, initial encounter: Secondary | ICD-10-CM | POA: Diagnosis present

## 2018-07-27 DIAGNOSIS — I6521 Occlusion and stenosis of right carotid artery: Secondary | ICD-10-CM

## 2018-07-27 DIAGNOSIS — F419 Anxiety disorder, unspecified: Secondary | ICD-10-CM | POA: Diagnosis present

## 2018-07-27 DIAGNOSIS — Z794 Long term (current) use of insulin: Secondary | ICD-10-CM | POA: Diagnosis not present

## 2018-07-27 DIAGNOSIS — S065X9A Traumatic subdural hemorrhage with loss of consciousness of unspecified duration, initial encounter: Principal | ICD-10-CM

## 2018-07-27 DIAGNOSIS — R569 Unspecified convulsions: Secondary | ICD-10-CM | POA: Diagnosis not present

## 2018-07-27 DIAGNOSIS — I129 Hypertensive chronic kidney disease with stage 1 through stage 4 chronic kidney disease, or unspecified chronic kidney disease: Secondary | ICD-10-CM | POA: Diagnosis present

## 2018-07-27 DIAGNOSIS — W010XXA Fall on same level from slipping, tripping and stumbling without subsequent striking against object, initial encounter: Secondary | ICD-10-CM | POA: Diagnosis present

## 2018-07-27 DIAGNOSIS — E119 Type 2 diabetes mellitus without complications: Secondary | ICD-10-CM

## 2018-07-27 DIAGNOSIS — T383X5A Adverse effect of insulin and oral hypoglycemic [antidiabetic] drugs, initial encounter: Secondary | ICD-10-CM

## 2018-07-27 DIAGNOSIS — Z66 Do not resuscitate: Secondary | ICD-10-CM | POA: Diagnosis present

## 2018-07-27 DIAGNOSIS — G960 Cerebrospinal fluid leak: Secondary | ICD-10-CM | POA: Diagnosis present

## 2018-07-27 DIAGNOSIS — R471 Dysarthria and anarthria: Secondary | ICD-10-CM | POA: Diagnosis present

## 2018-07-27 DIAGNOSIS — R29703 NIHSS score 3: Secondary | ICD-10-CM | POA: Diagnosis present

## 2018-07-27 DIAGNOSIS — Z79899 Other long term (current) drug therapy: Secondary | ICD-10-CM | POA: Diagnosis not present

## 2018-07-27 DIAGNOSIS — E11649 Type 2 diabetes mellitus with hypoglycemia without coma: Secondary | ICD-10-CM | POA: Diagnosis present

## 2018-07-27 DIAGNOSIS — E785 Hyperlipidemia, unspecified: Secondary | ICD-10-CM | POA: Diagnosis present

## 2018-07-27 DIAGNOSIS — Z1159 Encounter for screening for other viral diseases: Secondary | ICD-10-CM | POA: Diagnosis not present

## 2018-07-27 DIAGNOSIS — X58XXXA Exposure to other specified factors, initial encounter: Secondary | ICD-10-CM | POA: Diagnosis present

## 2018-07-27 DIAGNOSIS — G40409 Other generalized epilepsy and epileptic syndromes, not intractable, without status epilepticus: Secondary | ICD-10-CM | POA: Diagnosis present

## 2018-07-27 DIAGNOSIS — Z7982 Long term (current) use of aspirin: Secondary | ICD-10-CM | POA: Diagnosis not present

## 2018-07-27 DIAGNOSIS — R4781 Slurred speech: Secondary | ICD-10-CM | POA: Diagnosis not present

## 2018-07-27 DIAGNOSIS — N183 Chronic kidney disease, stage 3 (moderate): Secondary | ICD-10-CM | POA: Diagnosis present

## 2018-07-27 DIAGNOSIS — H1132 Conjunctival hemorrhage, left eye: Secondary | ICD-10-CM | POA: Diagnosis present

## 2018-07-27 DIAGNOSIS — E1122 Type 2 diabetes mellitus with diabetic chronic kidney disease: Secondary | ICD-10-CM | POA: Diagnosis present

## 2018-07-27 DIAGNOSIS — I6529 Occlusion and stenosis of unspecified carotid artery: Secondary | ICD-10-CM

## 2018-07-27 LAB — GLUCOSE, CAPILLARY
Glucose-Capillary: 101 mg/dL — ABNORMAL HIGH (ref 70–99)
Glucose-Capillary: 160 mg/dL — ABNORMAL HIGH (ref 70–99)
Glucose-Capillary: 170 mg/dL — ABNORMAL HIGH (ref 70–99)
Glucose-Capillary: 210 mg/dL — ABNORMAL HIGH (ref 70–99)
Glucose-Capillary: 298 mg/dL — ABNORMAL HIGH (ref 70–99)
Glucose-Capillary: 93 mg/dL (ref 70–99)

## 2018-07-27 LAB — CBC
HCT: 43.6 % (ref 39.0–52.0)
Hemoglobin: 15 g/dL (ref 13.0–17.0)
MCH: 31.6 pg (ref 26.0–34.0)
MCHC: 34.4 g/dL (ref 30.0–36.0)
MCV: 92 fL (ref 80.0–100.0)
Platelets: 234 10*3/uL (ref 150–400)
RBC: 4.74 MIL/uL (ref 4.22–5.81)
RDW: 12.9 % (ref 11.5–15.5)
WBC: 8.2 10*3/uL (ref 4.0–10.5)
nRBC: 0 % (ref 0.0–0.2)

## 2018-07-27 LAB — BASIC METABOLIC PANEL
Anion gap: 14 (ref 5–15)
BUN: 43 mg/dL — ABNORMAL HIGH (ref 8–23)
CO2: 14 mmol/L — ABNORMAL LOW (ref 22–32)
Calcium: 9 mg/dL (ref 8.9–10.3)
Chloride: 107 mmol/L (ref 98–111)
Creatinine, Ser: 2.7 mg/dL — ABNORMAL HIGH (ref 0.61–1.24)
GFR calc Af Amer: 27 mL/min — ABNORMAL LOW (ref >60)
GFR calc non Af Amer: 23 mL/min — ABNORMAL LOW (ref >60)
Glucose, Bld: 300 mg/dL — ABNORMAL HIGH (ref 70–99)
Potassium: 6 mmol/L — ABNORMAL HIGH (ref 3.5–5.1)
Sodium: 135 mmol/L (ref 135–145)

## 2018-07-27 LAB — MAGNESIUM: Magnesium: 2.4 mg/dL (ref 1.7–2.4)

## 2018-07-27 LAB — HIV ANTIBODY (ROUTINE TESTING W REFLEX): HIV Screen 4th Generation wRfx: NONREACTIVE

## 2018-07-27 MED ORDER — LEVOTHYROXINE SODIUM 25 MCG PO TABS
25.0000 ug | ORAL_TABLET | Freq: Every day | ORAL | Status: DC
  Administered 2018-07-28: 06:00:00 25 ug via ORAL
  Filled 2018-07-27: qty 1

## 2018-07-27 MED ORDER — INSULIN GLARGINE 100 UNIT/ML ~~LOC~~ SOLN
25.0000 [IU] | Freq: Every day | SUBCUTANEOUS | Status: DC
  Administered 2018-07-27: 22:00:00 25 [IU] via SUBCUTANEOUS
  Filled 2018-07-27 (×2): qty 0.25

## 2018-07-27 MED ORDER — INSULIN ASPART 100 UNIT/ML ~~LOC~~ SOLN
0.0000 [IU] | Freq: Three times a day (TID) | SUBCUTANEOUS | Status: DC
  Administered 2018-07-27: 5 [IU] via SUBCUTANEOUS
  Administered 2018-07-27: 17:00:00 3 [IU] via SUBCUTANEOUS

## 2018-07-27 MED ORDER — VITAMIN D 25 MCG (1000 UNIT) PO TABS
1000.0000 [IU] | ORAL_TABLET | Freq: Every day | ORAL | Status: DC
  Administered 2018-07-28: 09:00:00 1000 [IU] via ORAL
  Filled 2018-07-27: qty 1

## 2018-07-27 MED ORDER — LEVETIRACETAM 500 MG PO TABS: 500.0000 mg | ORAL_TABLET | Freq: Two times a day (BID) | ORAL | Status: DC

## 2018-07-27 MED ORDER — LORAZEPAM 2 MG/ML IJ SOLN: 1.0000 mg | INTRAMUSCULAR | Status: DC | PRN

## 2018-07-27 MED ORDER — VITAMIN D-3 25 MCG (1000 UT) PO CAPS: 1000.0000 [IU] | ORAL_CAPSULE | Freq: Every day | ORAL | Status: DC

## 2018-07-27 MED ORDER — LORAZEPAM 2 MG/ML IJ SOLN: 1.0000 mg | Freq: Once | INTRAMUSCULAR | Status: DC

## 2018-07-27 MED ORDER — INSULIN GLARGINE 100 UNIT/ML ~~LOC~~ SOLN: 50.0000 [IU] | Freq: Every day | SUBCUTANEOUS | Status: DC

## 2018-07-27 MED ORDER — ADULT MULTIVITAMIN W/MINERALS CH
1.0000 | ORAL_TABLET | Freq: Every day | ORAL | Status: DC
  Administered 2018-07-28: 1 via ORAL
  Filled 2018-07-27: qty 1

## 2018-07-27 MED ORDER — INSULIN ASPART 100 UNIT/ML ~~LOC~~ SOLN: 0.0000 [IU] | Freq: Every day | SUBCUTANEOUS | Status: DC

## 2018-07-27 MED ORDER — SODIUM CHLORIDE 0.9 % IV SOLN
INTRAVENOUS | Status: DC
  Administered 2018-07-27 (×2): via INTRAVENOUS

## 2018-07-27 MED ORDER — LEVETIRACETAM IN NACL 500 MG/100ML IV SOLN: 500.0000 mg | Freq: Two times a day (BID) | INTRAVENOUS | Status: DC

## 2018-07-27 MED ORDER — LEVETIRACETAM IN NACL 1000 MG/100ML IV SOLN
1000.0000 mg | Freq: Once | INTRAVENOUS | Status: AC
  Administered 2018-07-27: 1000 mg via INTRAVENOUS
  Filled 2018-07-27: qty 100

## 2018-07-27 MED ORDER — ONE-A-DAY MENS 50+ ADVANTAGE PO TABS: 1.0000 | ORAL_TABLET | Freq: Every day | ORAL | Status: DC

## 2018-07-27 MED ORDER — BUPROPION HCL 75 MG PO TABS
75.0000 mg | ORAL_TABLET | ORAL | Status: DC
  Filled 2018-07-27: qty 1

## 2018-07-27 MED ORDER — LEVETIRACETAM IN NACL 500 MG/100ML IV SOLN
500.0000 mg | Freq: Two times a day (BID) | INTRAVENOUS | Status: DC
  Administered 2018-07-27 – 2018-07-28 (×2): 500 mg via INTRAVENOUS
  Filled 2018-07-27 (×2): qty 100

## 2018-07-27 NOTE — Evaluation (Signed)
Physical Therapy Evaluation Patient Details Name: Johnny [name] MRN: [id number] DOB: 10-23-1950 Today's Date: 07/27/2018   History of Present Illness  68 y.o. male with medical history significant of type 2 diabetes, hypertension, hyperlipidemia, CKD 3 presenting to the hospital for evaluation of difficulty with difficulty with speech. Head CT showing bilateral low-attenuation extra-axial fluid collections representing either subdural hygromas or chronic subdural hematomas.  Acute thrombus, left subdural space, 12 x 17 mm. MRI showing no acute intracranial infarct.  Bilateral subdural collections, chronic in appearance on the right, either representing a chronic subdural hematoma or hygroma.  Collection on the left is acute on chronic in appearance with scattered internal acute blood products stable from prior CT. CT angiogram showing high-grade stenosis of the proximal right ICA, estimated 75 to 90%, potentially greater.   Clinical Impression  PTA pt living with niece and husband in 2 story home with steps to enter and bedroom on 2nd floor. Pt completely independent in mobility and iADLs. Pt currently limited in safe mobility by minor instability. Pt is mod I for bed mobility, supervision for transfers and ambulation and min guard for ascent/descent of stairs. Once seated in recliner after session pt noted to have slight slurring in speech. UE and LE motor function in tack, vision blurry from contusion to face not worse. RN notified. Pt will not require any additional PT services at discharge, however will work with pt acutely for higher level balance.     Follow Up Recommendations No PT follow up;Supervision/Assistance - 24 hour    Equipment Recommendations  None recommended by PT    Recommendations for Other Services       Precautions / Restrictions Precautions Precautions: Fall Precaution Comments: fall 2 years ago  Restrictions Weight Bearing Restrictions: No      Mobility  Bed  Mobility Overal bed mobility: Needs Assistance Bed Mobility: Supine to Sit     Supine to sit: Modified independent (Device/Increase time)     General bed mobility comments: increased time and effort and use of bed rail to come to EoB  Transfers Overall transfer level: Needs assistance Equipment used: None Transfers: Sit to/from Stand Sit to Stand: Supervision         General transfer comment: supervision for safety  Ambulation/Gait Ambulation/Gait assistance: Supervision Gait Distance (Feet): 300 Feet(1 x150 with RW, 1x150 with no AD) Assistive device: Rolling walker (2 wheeled);None Gait Pattern/deviations: Step-through pattern Gait velocity: slowed Gait velocity interpretation: 1.31 - 2.62 ft/sec, indicative of limited community ambulator General Gait Details: supervision for safety, initially ambulated with RW, vc for proximity, after stair training able to ambualate back to room without RW with supervision, mild instability, no overt LoB, pt reports he prefers not to use the RW  Stairs Stairs: Yes Stairs assistance: Min guard Stair Management: Two rails;Alternating pattern;Forwards Number of Stairs: 10 General stair comments: supervision for safety, strong, steady ascent/descent  Wheelchair Mobility    Modified Rankin (Stroke Patients Only)       Balance Overall balance assessment: Mild deficits observed, not formally tested                                           Pertinent Vitals/Pain Pain Assessment: 0-10 Pain Score: 2  Pain Location: head and eye Pain Descriptors / Indicators: Aching Pain Intervention(s): Limited activity within patient's tolerance;Monitored during session;Repositioned;Premedicated before session    Home Living Family/patient  expects to be discharged to:: Private residence Living Arrangements: Other (Comment)(niece and husband) Available Help at Discharge: Family;Available 24 hours/day Type of Home: House Home  Access: Stairs to enter Entrance Stairs-Rails: Right;Left;Can reach both Entrance Stairs-Number of Steps: 6 Home Layout: Two level(bed upstairs, bath downstair ) Home Equipment: Grab bars - tub/shower;Shower seat - built in      Prior Function Level of Independence: Independent                  Extremity/Trunk Assessment   Upper Extremity Assessment Upper Extremity Assessment: Overall WFL for tasks assessed    Lower Extremity Assessment Lower Extremity Assessment: Overall WFL for tasks assessed;RLE deficits/detail;LLE deficits/detail RLE Deficits / Details: ROM WFL, strength grossly 4/5 LLE Deficits / Details: ROM WFL, strength grossly 4/5       Communication   Communication: No difficulties  Cognition Arousal/Alertness: Awake/alert Behavior During Therapy: WFL for tasks assessed/performed Overall Cognitive Status: Within Functional Limits for tasks assessed                                        General Comments General comments (skin integrity, edema, etc.): Pt developed mildly slurred speech at end of session, UE and LE motor function remained the same. Notified RN to monitor         Assessment/Plan    PT Assessment Patient needs continued PT services  PT Problem List Decreased balance;Decreased mobility;Decreased knowledge of use of DME       PT Treatment Interventions Gait training;DME instruction;Stair training;Functional mobility training;Therapeutic activities;Therapeutic exercise;Balance training;Cognitive remediation;Patient/family education    PT Goals (Current goals can be found in the Care Plan section)  Acute Rehab PT Goals Patient Stated Goal: go home PT Goal Formulation: With patient Time For Goal Achievement: 08/10/18 Potential to Achieve Goals: Good    Frequency Min 3X/week    AM-PAC PT "6 Clicks" Mobility  Outcome Measure Help needed turning from your back to your side while in a flat bed without using bedrails?:  None Help needed moving from lying on your back to sitting on the side of a flat bed without using bedrails?: None Help needed moving to and from a bed to a chair (including a wheelchair)?: None Help needed standing up from a chair using your arms (e.g., wheelchair or bedside chair)?: None Help needed to walk in hospital room?: None Help needed climbing 3-5 steps with a railing? : None 6 Click Score: 24    End of Session Equipment Utilized During Treatment: Gait belt Activity Tolerance: Patient tolerated treatment well Patient left: in chair;with call bell/phone within reach Nurse Communication: Mobility status;Other (comment)(slightly slurred speech at end of session) PT Visit Diagnosis: Other symptoms and signs involving the nervous system (T90.300)    Time: 9233-0076 PT Time Calculation (min) (ACUTE ONLY): 30 min   Charges:   PT Evaluation $PT Eval Moderate Complexity: 1 Mod PT Treatments $Gait Training: 8-22 mins        Allahna Husband B. Migdalia Dk PT, DPT Acute Rehabilitation Services Pager [phone] Office [phone]   [place] 07/27/2018, 11:59 AM

## 2018-07-27 NOTE — Progress Notes (Signed)
Looks quite good this morning.  Patient complains of some mild left-sided headache around the area where he struck his head.  Patient has had no further seizure activity.  He has no complaints of speech difficulties.  He has no weakness or sensory loss.  He is afebrile.  Is awake and alert.  He is oriented and appropriate.  His speech is fluent.  His cranial nerve function is normal bilaterally.  Motor 5/5 bilaterally.  No pronator drift.  Patient with chronic appearing bilateral subdural hygromas with a very small amount of acute/subacute hemorrhage in his left posterior parietal region without mass-effect.  I suspect the patient may have suffered a brief seizure yesterday leading to his most recent fall.  I think the patient would benefit from a brief 2-week course of Keppra as an antiepileptic medication.  He may be mobilized from my standpoint.  I did not feel that he necessarily needs any follow-up imaging.  He is fine to be progressed toward discharge.

## 2018-07-27 NOTE — Consult Note (Signed)
Hospital Consult    Reason for Consult: High-grade right internal carotid artery stenosis Referring Physician: Addison MRN #:  [id number]  History of Present Illness: This is a 68 y.o. male with history of diabetes, hypertension, hyperlipidemia that vascular surgery has been consulted for high-grade right ICA stenosis.  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  Patient reports that he fell on Thursday and hit his head and the left side of his face.  The following day on Friday he then had some slurred speech and suspected seizure activity that prompted his presentation here when he was initially ruled as a code stroke.  On subsequent work-up there was no evidence of ischemic stroke but there was incidental finding of high-grade right ICA stenosis.  Vascular surgery was subsequently consulted today and neurosurgery has been following him.  Patient states that his carotid disease has been followed by the [facility] in [place].  He denies any previous head or neck surgery.  Denies any head or neck radiation.  Denies any previous TIA or strokes.  Denies any focal weakness in arm or leg.  Past Medical History:  Diagnosis Date  . Diabetes mellitus without complication ([place])   . Hyperlipemia   . Hypertension     History reviewed. No pertinent surgical history.  No Known Allergies  Prior to Admission medications   Medication Sig Start Date End Date Taking? Authorizing Provider  acetaminophen (TYLENOL) 650 MG CR tablet Take 1,300 mg by mouth 2 (two) times daily as needed for pain.   Yes [provider]  atenolol (TENORMIN) 25 MG tablet Take 25 mg by mouth 2 (two) times daily.   Yes [provider]  buPROPion (WELLBUTRIN) 75 MG tablet Take 75 mg by mouth every morning.   Yes [provider]  CALCIUM PO Take 1 tablet by mouth daily with  breakfast.   Yes [provider]  Cholecalciferol (VITAMIN D-3) 25 MCG (1000 UT) CAPS Take 1,000 Units by mouth daily with breakfast.   Yes [provider]  CINNAMON PO Take 1 capsule by mouth daily with breakfast.   Yes [provider]  insulin aspart (NOVOLOG) 100 UNIT/ML injection Inject 20-25 Units into the skin 2 (two) times daily before a meal. Breakfast and Supper (evening meal)   Yes [provider]  insulin glargine (LANTUS) 100 UNIT/ML injection Inject 50 Units into the skin daily before breakfast.    Yes [provider]  levothyroxine (SYNTHROID) 25 MCG tablet Take 25 mcg by mouth daily before breakfast.   Yes [provider]  lisinopril (PRINIVIL,ZESTRIL) 20 MG tablet Take 20 mg by mouth daily.    Yes [provider]  Multiple Vitamins-Minerals (ONE-A-DAY MENS 50+ ADVANTAGE) TABS Take 1 tablet by mouth daily with breakfast.   Yes [provider]  aspirin EC 81 MG tablet Take 81 mg by mouth daily.    [provider]  atorvastatin (LIPITOR) 80 MG tablet Take 40 mg by mouth daily.    [provider]  glucose blood (PRECISION XTRA TEST STRIPS) test strip 1 each by Other route as needed (blood glucose level). Use as instructed    [provider]  Insulin Syringe-Needle U-100 (INSULIN SYRINGE .5CC/30GX1/2") 30G X 1/2" 0.5 ML MISC Inject 1 Syringe into the skin 4 (four) times daily.    [provider]    Social History   Socioeconomic  History  . Marital status: Single    Spouse name: Not on file  . Number of children: Not on file  . Years of education: Not on file  . Highest education level: Not on file  Occupational History  . Not on file  Social Needs  . Financial resource strain: Not on file  . Food insecurity    Worry: Not on file    Inability: Not on file  . Transportation needs    Medical: Not on file    Non-medical: Not on file  Tobacco Use  . Smoking status: Never  Smoker  . Smokeless tobacco: Never Used  Substance and Sexual Activity  . Alcohol use: No  . Drug use: No  . Sexual activity: Not on file  Lifestyle  . Physical activity    Days per week: Not on file    Minutes per session: Not on file  . Stress: Not on file  Relationships  . Social Herbalist on phone: Not on file    Gets together: Not on file    Attends religious service: Not on file    Active member of club or organization: Not on file    Attends meetings of clubs or organizations: Not on file    Relationship status: Not on file  . Intimate partner violence    Fear of current or ex partner: Not on file    Emotionally abused: Not on file    Physically abused: Not on file    Forced sexual activity: Not on file  Other Topics Concern  . Not on file  Social History Narrative  . Not on file     History reviewed. No pertinent family history.  ROS: [x]  Positive   [ ]  Negative   [ ]  All sytems reviewed and are negative  Cardiovascular: []  chest pain/pressure []  palpitations []  SOB lying flat []  DOE []  pain in legs while walking []  pain in legs at rest []  pain in legs at night []  non-healing ulcers []  hx of DVT []  swelling in legs  Pulmonary: []  productive cough []  asthma/wheezing []  home O2  Neurologic: []  weakness in []  arms []  legs []  numbness in []  arms []  legs []  hx of CVA []  mini stroke [x] difficulty speaking or slurred speech []  temporary loss of vision in one eye []  dizziness  Hematologic: []  hx of cancer []  bleeding problems []  problems with blood clotting easily  Endocrine:   [x]  diabetes []  thyroid disease  GI []  vomiting blood []  blood in stool  GU: []  CKD/renal failure []  HD--[]  M/W/F or []  T/T/S []  burning with urination []  blood in urine  Psychiatric: []  anxiety []  depression  Musculoskeletal: []  arthritis []  joint pain  Integumentary: []  rashes []  ulcers  Constitutional: []  fever []  chills   Physical  Examination  Vitals:   07/27/18 0732 07/27/18 1154  BP: (!) 104/59 108/60  Pulse: 80 70  Resp: 16 16  Temp: 98.6 F (37 C) (!) 97.5 F (36.4 C)  SpO2: 96% 97%   Body mass index is 30.91 kg/m.  General:  Sitting in chair during initial consultation Gait: Not observed HENT: Bruising to left side of face Pulmonary: normal non-labored breathing, without Rales, rhonchi,  wheezing Cardiac: regular, without  Murmurs, rubs or gallops Abdomen: soft, NT/ND, no masses Skin: without rashes Vascular Exam/Pulses:  Right Left  Radial 2+ (normal) 2+ (normal)  Ulnar    Femoral    Popliteal    DP  PT     Extremities: without ischemic changes, without Gangrene , without cellulitis; without open wounds;  Musculoskeletal: no muscle wasting or atrophy  Neurologic: A&O X 3; Appropriate Affect ; SENSATION: normal; MOTOR FUNCTION:  moving all extremities equally. Slurred speech.   After initial consultation, patient not to have generalized seizure activity, nurse called to room   CBC    Component Value Date/Time   WBC 8.2 07/27/2018 0522   RBC 4.74 07/27/2018 0522   HGB 15.0 07/27/2018 0522   HCT 43.6 07/27/2018 0522   PLT 234 07/27/2018 0522   MCV 92.0 07/27/2018 0522   MCH 31.6 07/27/2018 0522   MCHC 34.4 07/27/2018 0522   RDW 12.9 07/27/2018 0522   LYMPHSABS 5.4 (H) 07/26/2018 1649   MONOABS 0.8 07/26/2018 1649   EOSABS 0.2 07/26/2018 1649   BASOSABS 0.1 07/26/2018 1649    BMET    Component Value Date/Time   NA 143 07/26/2018 1651   K 4.4 07/26/2018 1651   CL 107 07/26/2018 1651   CO2 23 07/26/2018 1649   GLUCOSE 75 07/26/2018 1651   BUN 38 (H) 07/26/2018 1651   CREATININE 2.30 (H) 07/26/2018 1651   CALCIUM 9.9 07/26/2018 1649   GFRNONAA 27 (L) 07/26/2018 1649   GFRAA 31 (L) 07/26/2018 1649    COAGS: Lab Results  Component Value Date   INR 1.0 07/26/2018   INR 1.0 11/30/2006     Non-Invasive Vascular Imaging:    I independently reviewed his CTA neck and I  agree he has likely a greater than 80% stenosis in the proximal right ICA.  No flow-limiting stenosis in the left ICA.   ASSESSMENT/PLAN: This is a 68 y.o. male there was initially made as a code stroke on presentation and ultimately found to have chronic appearing bilateral subdural hygromas and a small acute left parietal hemorrhage following a fall on Thursday.  Incidental stroke work-up found to have high-grade right ICA stenosis that is likely incidental.  I did recommend to the patient that we would recommend a carotid endarterectomy for stenosis over 80% in the asymptomatic setting.  Discussed with Dr. Annette Stable (neurosurgery) and he said patient could have full anticoagulation for carotid intervention after 2 weeks given acute brain bleed.  During my discussion with the patient he had another seizure event and I did not get to finish my discussion with him.  Nurses came to bedside to provide assistance, patient being loaded on Keppra, I discussed with Hospitalist.  I will follow-up with him tomorrow to see how he wants to proceed forward.  Marty Heck, MD [facility] Office: [phone] Pager: [phone]

## 2018-07-27 NOTE — Progress Notes (Addendum)
[facility]                                                                              Patient Demographics  Johnny [name], is a 68 y.o. male, DOB - 08/06/1950, OYD:[id number]  Admit date - 07/26/2018   Admitting Physician Shela Leff, MD  Outpatient Primary MD for the patient is Default, Provider, MD  Outpatient specialists:   LOS - 0  days   Medical records reviewed and are as summarized below:    Chief Complaint  Patient presents with   Code Stroke       Brief summary   Patient is a 68 year old male with history of diabetes, hypertension, hyperlipidemia, known right carotid stenosis presented to ED initially as a code stroke.  Patient reported that he was in his usual state of health until 330pm on the day of admission, when his family member noticed that he was slurring his speech.  Patient reported that 24 hours prior to admission, he fell and hit his left side of the head and face while he was getting out of the car and slipped.  Patient had refused medical attention at that time.  On the day of admission, he had sudden change in his speech, was seen by EMS and found to have left facial droop, dysarthria. CT head showed bilateral subdural hygromas or chronic subdural hematomas, acute thrombus left subdural space.  Neurology was consulted.  MRI brain showed no acute intracranial infarct, chronic bilateral subdural collections, acute/subacute hemorrhage in the left posterior parietal region without mass-effect.  Neurosurgery was consulted.  Patient was admitted for further work-up.  Assessment & Plan    Principal Problem:   Subdural hematoma ([place]) -Likely due to the fall.  Patient presented initially with the code stroke as slurred speech, facial droop. -MRI of the brain showed no acute stroke, chronic bilateral subdural collections, acute/subacute hemorrhage in the left posterior parietal region without mass-effect. -Neurosurgery was consulted,  recommended repeat CT head which is stable. -Still intermittent speech difficulties, PT evaluation pending -Complaining of mild left-sided headache and mild blurry vision.  No other neurological deficits. -Seen by neurosurgery this morning, Dr. Trenton Gammon, suspect patient may have suffered a brief seizure leading to his most recent fall.  Recommended 2-week course of Keppra.  Does not need any follow-up imaging unless new neurological deficits.  Started on Keppra 500 mg twice daily. Addendum; 12:45 PM Patient was placed on oral Keppra today, had not received any dose yet.  Subsequently, had a 2-minute witnessed seizure, will place on IV loading 1000 mg dose of Keppra followed by Keppra 500 mg IV every 12 hours.  Seizure precautions, obtain stat BMET, magnesium.  Discussed with neurology, Dr Rory Percy, will follow.   Active Problems:   Internal carotid artery stenosis, right -CT angiogram of the head and neck showed high-grade stenosis of the proximal right ICA 75 to 90%, potentially greater. -Vascular surgery consulted, discussed with Dr. Carlis Abbott.  Challenging situation as patient has acute/subacute brain bleed and would likely need anticoagulation with intervention for right ICA stenosis.  Discussed with patient, states he has follow-up for his RICA stenosis at the [place]  and does not want surgical intervention at this point    HTN (hypertension) -BP currently soft, placed on gentle hydration to avoid low flow state, goal SBP less than 140, continue hydralazine as needed with parameters -Hold lisinopril, atenolol    Type 2 diabetes mellitus (HCC) -Follow hemoglobin A1c, placed on sliding scale insulin while inpatient -CBGs controlled, decrease Lantus to 25 units at bedtime  Anxiety Continue Wellbutrin  Code Status: Full CODE STATUS DVT Prophylaxis: SCDs Family Communication: Discussed in detail with the patient, all imaging results, lab results explained to the patient    Disposition Plan: Patient  still having intermittent episodes of slurred speech, placed on IV fluids.  Started on Keppra.  Observe overnight.  Likely DC home in a.m. if stable  Time Spent in minutes 35 minutes  Procedures:  MRI brain, CT angiogram head and neck  Consultants:   Neurology Neurosurgery Vascular surgery  Antimicrobials:   Anti-infectives (From admission, onward)   None          Medications  Scheduled Meds:  dextrose  1 ampule Intravenous Once   levETIRAcetam  500 mg Oral BID   sodium chloride flush  3 mL Intravenous Once   Continuous Infusions: PRN Meds:.acetaminophen **OR** acetaminophen, hydrALAZINE      Subjective:   Briana Newman was seen and examined today.  Complaining of mild left-sided headache, blurry vision.  No facial drooping or slurred speech.  Has not gotten up from the bed yet.   Patient denies dizziness, chest pain, shortness of breath, abdominal pain, N/V/D/C.   Objective:   Vitals:   07/26/18 2048 07/27/18 0008 07/27/18 0404 07/27/18 0732  BP: (!) 177/86 (!) 126/59 (!) 106/57 (!) 104/59  Pulse: 74 81 76 80  Resp: 19 18 18 16   Temp: 98 F (36.7 C) 98.1 F (36.7 C) 98 F (36.7 C) 98.6 F (37 C)  TempSrc: Oral Oral Oral Oral  SpO2: 100% 95% 94% 96%  Weight:      Height:        Intake/Output Summary (Last 24 hours) at 07/27/2018 1136 Last data filed at 07/27/2018 0022 Gross per 24 hour  Intake --  Output 100 ml  Net -100 ml     Wt Readings from Last 3 Encounters:  07/26/18 92.2 kg     Exam  General: Alert and oriented x 3, NAD  Eyes: Petechial subconjunctival hemorrhage in the left eye,  HEENT: Left side of the face, cheek bruised  Cardiovascular: S1 S2 auscultated, Regular rate and rhythm.  Respiratory: Clear to auscultation bilaterally, no wheezing, rales or rhonchi  Gastrointestinal: Soft, nontender, nondistended, + bowel sounds  Ext: no pedal edema bilaterally  Neuro: No acute neurological deficits, strength 5/5 in upper  lower extremities, no dysarthria  Musculoskeletal: No digital cyanosis, clubbing  Skin: No rashes  Psych: Normal affect and demeanor, alert and oriented x3    Data Reviewed:  I have personally reviewed following labs and imaging studies  Micro Results Recent Results (from the past 240 hour(s))  SARS Coronavirus 2     Status: None   Collection Time: 07/26/18  6:45 PM  Result Value Ref Range Status   SARS Coronavirus 2 NOT DETECTED NOT DETECTED Final    Comment: (NOTE) SARS-CoV-2 target nucleic acids are NOT DETECTED. The SARS-CoV-2 RNA is generally detectable in upper and lower respiratory specimens during the acute phase of infection.  Negative  results do not preclude SARS-CoV-2 infection, do not rule out co-infections with other pathogens, and should not  be used as the sole basis for treatment or other patient management decisions.  Negative results must be combined with clinical observations, patient history, and epidemiological information. The expected result is Not Detected. Fact Sheet for Patients: http://www.biofiredefense.com/wp-content/uploads/2020/03/BIOFIRE-COVID -19-patients.pdf Fact Sheet for Healthcare Providers: http://www.biofiredefense.com/wp-content/uploads/2020/03/BIOFIRE-COVID -19-hcp.pdf This test is not yet approved or cleared by the [place] and  has been authorized for detection and/or diagnosis of SARS-CoV-2 by FDA under an Emergency Use Authorization (EUA).  This EUA will remain in effec t (meaning this test can be used) for the duration of  the COVID-19 declaration under Section 564(b)(1) of the Act, 21 U.S.C. section 360bbb-3(b)(1), unless the authorization is terminated or revoked sooner. Performed at [facility], [place] [address], [place], [place] [address]     Radiology Reports Ct Angio Head W Or Wo Contrast  Result Date: 07/26/2018 CLINICAL DATA:  Unsteady gait. Slurred speech. EXAM: CT ANGIOGRAPHY HEAD AND NECK  TECHNIQUE: Multidetector CT imaging of the head and neck was performed using the standard protocol during bolus administration of intravenous contrast. Multiplanar CT image reconstructions and MIPs were obtained to evaluate the vascular anatomy. Carotid stenosis measurements (when applicable) are obtained utilizing NASCET criteria, using the distal internal carotid diameter as the denominator. CONTRAST:  70mL OMNIPAQUE IOHEXOL 350 MG/ML SOLN COMPARISON:  Code stroke CT head earlier today. FINDINGS: CTA NECK FINDINGS Aortic arch: Standard branching. Imaged portion shows no evidence of aneurysm or dissection. No significant stenosis of the major arch vessel origins. Aortic atherosclerosis. Right carotid system: Severe proximal RIGHT ICA stenosis, luminal narrowing to 1 mm, estimated 75-90%, possibly greater. This is related to calcific and noncalcified plaque in the proximal RIGHT ICA. No dissection, although the cervical ICA on the RIGHT is decreased in caliber reflecting the high-grade proximal stenosis. Estimated 50-75% stenosis of the proximal ECA. Nonstenotic plaque mid common carotid artery. Left carotid system: No evidence of dissection, stenosis (50% or greater) or occlusion. Calcific and noncalcified plaque. Vertebral arteries: Codominant. No evidence of dissection, stenosis (50% or greater) or occlusion. Skeleton: Reported separately. Other neck: No masses. Upper chest: No pneumothorax or nodule. Review of the MIP images confirms the above findings CTA HEAD FINDINGS Anterior circulation: Calcification of the cavernous internal carotid arteries consistent with cerebrovascular atherosclerotic disease. No signs of intracranial large vessel occlusion. Posterior circulation: No significant stenosis, proximal occlusion, aneurysm, or vascular malformation. Venous sinuses: As permitted by contrast timing, patent. Anatomic variants: None of significance Delayed phase: Not performed. Review of the MIP images confirms  the above findings IMPRESSION: 1. 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 Vascular surgical consultation is warranted, given the reduced caliber of the cervical ICA. 2. Non stenotic atheromatous change LEFT carotid bifurcation. 3. No intracranial large vessel occlusion. 4. See additional significant findings noncontrast CT scan related to subdural fluid collections and focal LEFT parietal subdural clot. 5.  Aortic Atherosclerosis (ICD10-I70.0). Electronically Signed   By: Staci Righter M.D.   On: 07/26/2018 17:36   Ct Head Wo Contrast  Result Date: 07/27/2018 CLINICAL DATA:  Fall yesterday. Head injury. Follow-up subdural hemorrhage. EXAM: CT HEAD WITHOUT CONTRAST TECHNIQUE: Contiguous axial images were obtained from the base of the skull through the vertex without intravenous contrast. COMPARISON:  CT and MRI head 07/26/2018 FINDINGS: Brain: Small high-density subdural hemorrhage in the left parietal convexity is unchanged. No new hemorrhage. Moderately large low-density subdural fluid collections bilaterally are stable and likely due to chronic subdural hemorrhage/subdural hygroma. No midline shift. Generalized atrophy  without hydrocephalus. No acute ischemic infarct or mass. Vascular: Negative for hyperdense vessel. Atherosclerotic calcification cavernous carotid bilaterally. Skull: Negative Sinuses/Orbits: Paranasal sinuses clear. Small mastoid effusion bilaterally unchanged. 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 Small amount of acute hemorrhage in the left parietal subdural space is unchanged. No midline shift. Electronically Signed   By: Franchot Gallo M.D.   On: 07/27/2018 10:20   Ct Angio Neck W Or Wo Contrast  Result Date: 07/26/2018 CLINICAL DATA:  Unsteady gait. Slurred speech. EXAM: CT ANGIOGRAPHY HEAD AND NECK TECHNIQUE: Multidetector CT imaging of the head and neck was performed using the standard  protocol during bolus administration of intravenous contrast. Multiplanar CT image reconstructions and MIPs were obtained to evaluate the vascular anatomy. Carotid stenosis measurements (when applicable) are obtained utilizing NASCET criteria, using the distal internal carotid diameter as the denominator. CONTRAST:  56mL OMNIPAQUE IOHEXOL 350 MG/ML SOLN COMPARISON:  Code stroke CT head earlier today. FINDINGS: CTA NECK FINDINGS Aortic arch: Standard branching. Imaged portion shows no evidence of aneurysm or dissection. No significant stenosis of the major arch vessel origins. Aortic atherosclerosis. Right carotid system: Severe proximal RIGHT ICA stenosis, luminal narrowing to 1 mm, estimated 75-90%, possibly greater. This is related to calcific and noncalcified plaque in the proximal RIGHT ICA. No dissection, although the cervical ICA on the RIGHT is decreased in caliber reflecting the high-grade proximal stenosis. Estimated 50-75% stenosis of the proximal ECA. Nonstenotic plaque mid common carotid artery. Left carotid system: No evidence of dissection, stenosis (50% or greater) or occlusion. Calcific and noncalcified plaque. Vertebral arteries: Codominant. No evidence of dissection, stenosis (50% or greater) or occlusion. Skeleton: Reported separately. Other neck: No masses. Upper chest: No pneumothorax or nodule. Review of the MIP images confirms the above findings CTA HEAD FINDINGS Anterior circulation: Calcification of the cavernous internal carotid arteries consistent with cerebrovascular atherosclerotic disease. No signs of intracranial large vessel occlusion. Posterior circulation: No significant stenosis, proximal occlusion, aneurysm, or vascular malformation. Venous sinuses: As permitted by contrast timing, patent. Anatomic variants: None of significance Delayed phase: Not performed. Review of the MIP images confirms the above findings IMPRESSION: 1. High-grade stenosis of the proximal RIGHT ICA, estimated  75-90%, potentially greater. Vascular surgical consultation is warranted, given the reduced caliber of the cervical ICA. 2. Non stenotic atheromatous change LEFT carotid bifurcation. 3. No intracranial large vessel occlusion. 4. See additional significant findings noncontrast CT scan related to subdural fluid collections and focal LEFT parietal subdural clot. 5.  Aortic Atherosclerosis (ICD10-I70.0). Electronically Signed   By: Staci Righter M.D.   On: 07/26/2018 17:36   Mr Brain Wo Contrast  Result Date: 07/26/2018 CLINICAL DATA:  Initial evaluation for acute slurred speech, unsteady gait, left-sided facial droop, possible subdural hematoma. EXAM: MRI HEAD WITHOUT CONTRAST TECHNIQUE: Multiplanar, multiecho pulse sequences of the brain and surrounding structures were obtained without intravenous contrast. COMPARISON:  Prior CT and CTA from earlier same day. FINDINGS: Brain: Moderately advanced cerebral volume loss for age. Patchy T2/FLAIR hyperintensity within the periventricular and deep white matter both cerebral hemispheres most consistent with chronic small vessel ischemic disease, mild in nature. Superimposed remote lacunar infarct present at the right basal ganglia. Small chronic cortical and subcortical infarcts seen involving the anterior left frontal lobe (series 15, image 18). Associated chronic hemosiderin staining. No abnormal foci of restricted diffusion to suggest acute or subacute ischemia. Gray-white matter differentiation maintained. No other areas of chronic cortical infarction. No mass lesion or midline shift. Ventricles normal in size  without hydrocephalus. Bilateral extra-axial subdural collections again seen, measuring up to approximately 3 mm bilaterally. Collection on the right is largely chronic in appearance, and could reflect a chronic subdural hematoma versus hygroma. Scattered acute blood products present within the left extra-axial collection, with focus of thrombus measuring 2.4 cm  overlying the left parietal convexity (series 16, image 35). Pituitary gland suprasellar region within normal limits. Midline structures intact and normal. Vascular: Major intracranial vascular flow voids are maintained. Skull and upper cervical spine: Craniocervical junction within normal limits. Upper cervical spine normal. Bone marrow signal intensity within normal limits. No scalp soft tissue abnormality. Sinuses/Orbits: Globes and orbital soft tissues within normal limits. Paranasal sinuses are largely clear. Small bilateral mastoid effusions, right slightly larger than left, of doubtful significance. Inner ear structures normal. Other: None. IMPRESSION: 1. No acute intracranial infarct identified. 2. Bilateral subdural collections, chronic in appearance on the right, either representing a chronic subdural hematoma or hygroma. Collection on the left is acute on chronic in appearance with scattered internal acute blood products, stable from prior CT. 3. Small chronic left frontal lobe infarct. 4. Underlying moderately advanced cerebral atrophy with mild chronic microvascular ischemic disease. Electronically Signed   By: Jeannine Boga M.D.   On: 07/26/2018 18:05   Ct C-spine No Charge  Result Date: 07/26/2018 CLINICAL DATA:  Slurred speech. Fall. Subdural fluid collections. EXAM: CT CERVICAL SPINE WITHOUT CONTRAST TECHNIQUE: Multidetector CT imaging of the cervical spine was performed without intravenous contrast. Multiplanar CT image reconstructions were also generated. COMPARISON:  None. FINDINGS: Alignment: Physiologic. Skull base and vertebrae: No acute fracture. No primary bone lesion or focal pathologic process. Soft tissues and spinal canal: No prevertebral fluid or swelling. No visible canal hematoma. Disc levels: Intervertebral disc spaces are largely preserved. There is no stenosis or foraminal narrowing. Upper chest: Negative. Other: None IMPRESSION: Negative exam.  No cervical spine  fracture or traumatic subluxation. Electronically Signed   By: Staci Righter M.D.   On: 07/26/2018 17:39   Ct Head Code Stroke Wo Contrast  Result Date: 07/26/2018 CLINICAL DATA:  Code stroke.  Slurred speech. Unsteady gait. EXAM: CT HEAD WITHOUT CONTRAST TECHNIQUE: Contiguous axial images were obtained from the base of the skull through the vertex without intravenous contrast. COMPARISON:  CTA head neck reported separately. FINDINGS: Brain: BILATERAL low attenuation extra-axial fluid collections, representing either subdural hygromas or chronic subdural hematomas, 15-16 mm thick, flattening of the cortical sulci. Additional acute subdural thrombus 12 x 17 mm, over the RIGHT anterior parietal lobe hyperattenuating. No parenchymal hemorrhage. Generalized atrophy. Hypoattenuation of white matter. No hydrocephalus, or visible acute infarct. Vascular: Calcification of the cavernous internal carotid arteries consistent with cerebrovascular atherosclerotic disease. No signs of intracranial large vessel occlusion. Skull: No skull fracture. Sinuses/Orbits: No acute findings. Other: None. ASPECTS [facility] Stroke Program Early CT Score) - Ganglionic level infarction (caudate, lentiform nuclei, internal capsule, insula, M1-M3 cortex): 7 - Supraganglionic infarction (M4-M6 cortex): 3 Total score (0-10 with 10 being normal): 10 IMPRESSION: 1. BILATERAL low attenuation extra-axial fluid collections representing either subdural hygromas or chronic subdural hematomas. 2. ASPECTS is 10. 3. Acute thrombus, LEFT subdural space, 12 x 17 mm. Thrombolysis/tPA is contraindicated. 4. These results were called by telephone at the time of interpretation on 07/26/2018 at 5:07 pm to Dr. Rory Percy , who verbally acknowledged these results. Electronically Signed   By: Staci Righter M.D.   On: 07/26/2018 17:12    Lab Data:  CBC: Recent Labs  Lab 07/26/18 1649 07/26/18 1651  07/27/18 0522  WBC 11.3*  --  8.2  NEUTROABS 4.8  --   --     HGB 15.8 16.3 15.0  HCT 48.0 48.0 43.6  MCV 93.6  --  92.0  PLT 240  --  314   Basic Metabolic Panel: Recent Labs  Lab 07/26/18 1649 07/26/18 1651  NA 141 143  K 4.5 4.4  CL 109 107  CO2 23  --   GLUCOSE 75 75  BUN 37* 38*  CREATININE 2.37* 2.30*  CALCIUM 9.9  --    GFR: Estimated Creatinine Clearance: 33.4 mL/min (A) (by C-G formula based on SCr of 2.3 mg/dL (H)). Liver Function Tests: Recent Labs  Lab 07/26/18 1649  AST 26  ALT 25  ALKPHOS 37*  BILITOT 0.8  PROT 7.1  ALBUMIN 4.4   No results for input(s): LIPASE, AMYLASE in the last 168 hours. No results for input(s): AMMONIA in the last 168 hours. Coagulation Profile: Recent Labs  Lab 07/26/18 1649  INR 1.0   Cardiac Enzymes: No results for input(s): CKTOTAL, CKMB, CKMBINDEX, TROPONINI in the last 168 hours. BNP (last 3 results) No results for input(s): PROBNP in the last 8760 hours. HbA1C: No results for input(s): HGBA1C in the last 72 hours. CBG: Recent Labs  Lab 07/26/18 2101 07/26/18 2154 07/27/18 0010 07/27/18 0403 07/27/18 0637  GLUCAP 69* 214* 170* 93 101*   Lipid Profile: No results for input(s): CHOL, HDL, LDLCALC, TRIG, CHOLHDL, LDLDIRECT in the last 72 hours. Thyroid Function Tests: No results for input(s): TSH, T4TOTAL, FREET4, T3FREE, THYROIDAB in the last 72 hours. Anemia Panel: No results for input(s): VITAMINB12, FOLATE, FERRITIN, TIBC, IRON, RETICCTPCT in the last 72 hours. Urine analysis:    Component Value Date/Time   COLORURINE YELLOW 06/24/2008 2226   APPEARANCEUR CLEAR 06/24/2008 2226   LABSPEC 1.022 06/24/2008 2226   PHURINE 5.0 06/24/2008 2226   GLUCOSEU 250 (A) 06/24/2008 2226   HGBUR MODERATE (A) 06/24/2008 2226   BILIRUBINUR NEGATIVE 06/24/2008 2226   KETONESUR NEGATIVE 06/24/2008 2226   PROTEINUR 100 (A) 06/24/2008 2226   UROBILINOGEN 0.2 06/24/2008 2226   NITRITE NEGATIVE 06/24/2008 2226   LEUKOCYTESUR NEGATIVE 06/24/2008 2226     Jonty Morrical  M.D. [facility] 07/27/2018, 11:36 AM  Pager: [phone] Between 7am to 7pm - call Pager - [phone]  After 7pm go to [email or web address] - password [email or web address]  Call night coverage person covering after 7pm

## 2018-07-27 NOTE — Progress Notes (Signed)
Neurology Progress Note   S:// Patient seen and examined after I was recalled to see him for seizures. Patient had a proximately 1 to 2-minute witnessed generalized tonic-clonic seizure He was recently seen last evening by me for code stroke for confusion and a stroke work-up revealed a traumatic left-sided small subdural hematoma. He was seen by neurosurgery-Dr. Trenton Gammon who recommended prophylactic antiepileptics, which he was not on at the time of seizures.   O:// Current vital signs: BP (!) 171/71   Pulse 79   Temp (!) 97.5 F (36.4 C) (Oral)   Resp 20   Ht 5\' 8"  (1.727 m)   Wt 92.2 kg   SpO2 100%   BMI 30.91 kg/m  Vital signs in last 24 hours: Temp:  [97.5 F (36.4 C)-98.9 F (37.2 C)] 97.5 F (36.4 C) (06/20 1154) Pulse Rate:  [70-81] 79 (06/20 1226) Resp:  [15-30] 20 (06/20 1226) BP: (104-184)/(57-119) 171/71 (06/20 1226) SpO2:  [94 %-100 %] 100 % (06/20 1226) Weight:  [92.2 kg-98.3 kg] 92.2 kg (06/19 1930) On my exam today, he is sleeping but awakes to voice. He has unchanged bruising on the left forehead temple and cheek from yesterday. His eyes also red with subconjunctival hemorrhage on the left. Neurological exam: Awake alert oriented x3 although a little sluggish to respond to questions than yesterday.  Mild dysarthria/hesitancy in his speech. Pupils are equal round reactive light, extraocular movements are intact, visual fields are full, mild left lower facial weakness. On motor examination he has symmetric antigravity 5/5 in all 4 extremities Sensation is intact light touch No ataxia Gait testing deferred at this time Essentially unchanged exam from yesterday.  Medications  Current Facility-Administered Medications:  .  0.9 %  sodium chloride infusion, , Intravenous, Continuous, Rai, Ripudeep K, MD, Last Rate: 100 mL/hr at 07/27/18 1233 .  acetaminophen (TYLENOL) tablet 650 mg, 650 mg, Oral, Q6H PRN, 650 mg at 07/27/18 0901 **OR** acetaminophen (TYLENOL)  suppository 650 mg, 650 mg, Rectal, Q6H PRN, Shela Leff, MD .  Derrill Memo ON 07/28/2018] cholecalciferol (VITAMIN D3) tablet 1,000 Units, 1,000 Units, Oral, Daily, Rai, Ripudeep K, MD .  dextrose 50 % solution 50 mL, 1 ampule, Intravenous, Once, Shela Leff, MD .  hydrALAZINE (APRESOLINE) injection 5 mg, 5 mg, Intravenous, Q4H PRN, Shela Leff, MD, 5 mg at 07/26/18 2233 .  insulin aspart (novoLOG) injection 0-5 Units, 0-5 Units, Subcutaneous, QHS, Rai, Ripudeep K, MD .  insulin aspart (novoLOG) injection 0-9 Units, 0-9 Units, Subcutaneous, TID WC, Rai, Ripudeep K, MD, 5 Units at 07/27/18 1256 .  insulin glargine (LANTUS) injection 25 Units, 25 Units, Subcutaneous, QHS, Rai, Ripudeep K, MD .  levETIRAcetam (KEPPRA) IVPB 1000 mg/100 mL premix, 1,000 mg, Intravenous, Once, Rai, Ripudeep K, MD, Last Rate: 400 mL/hr at 07/27/18 1327, 1,000 mg at 07/27/18 1327 .  levETIRAcetam (KEPPRA) IVPB 500 mg/100 mL premix, 500 mg, Intravenous, Q12H, Rai, Ripudeep K, MD .  Derrill Memo ON 07/28/2018] levothyroxine (SYNTHROID) tablet 25 mcg, 25 mcg, Oral, QAC breakfast, Rai, Ripudeep K, MD .  LORazepam (ATIVAN) injection 1 mg, 1 mg, Intravenous, Once, Rai, Ripudeep K, MD .  LORazepam (ATIVAN) injection 1-2 mg, 1-2 mg, Intravenous, Q4H PRN, Rai, Ripudeep K, MD .  Derrill Memo ON 07/28/2018] multivitamin with minerals tablet 1 tablet, 1 tablet, Oral, Daily, Rai, Ripudeep K, MD .  sodium chloride flush (NS) 0.9 % injection 3 mL, 3 mL, Intravenous, Once, Shela Leff, MD Labs CBC    Component Value Date/Time   WBC 8.2 07/27/2018 0522  RBC 4.74 07/27/2018 0522   HGB 15.0 07/27/2018 0522   HCT 43.6 07/27/2018 0522   PLT 234 07/27/2018 0522   MCV 92.0 07/27/2018 0522   MCH 31.6 07/27/2018 0522   MCHC 34.4 07/27/2018 0522   RDW 12.9 07/27/2018 0522   LYMPHSABS 5.4 (H) 07/26/2018 1649   MONOABS 0.8 07/26/2018 1649   EOSABS 0.2 07/26/2018 1649   BASOSABS 0.1 07/26/2018 1649    CMP     Component Value  Date/Time   NA 143 07/26/2018 1651   K 4.4 07/26/2018 1651   CL 107 07/26/2018 1651   CO2 23 07/26/2018 1649   GLUCOSE 75 07/26/2018 1651   BUN 38 (H) 07/26/2018 1651   CREATININE 2.30 (H) 07/26/2018 1651   CALCIUM 9.9 07/26/2018 1649   PROT 7.1 07/26/2018 1649   ALBUMIN 4.4 07/26/2018 1649   AST 26 07/26/2018 1649   ALT 25 07/26/2018 1649   ALKPHOS 37 (L) 07/26/2018 1649   BILITOT 0.8 07/26/2018 1649   GFRNONAA 27 (L) 07/26/2018 1649   GFRAA 31 (L) 07/26/2018 1649    glycosylated hemoglobin  Lipid Panel     Component Value Date/Time   CHOL (H) 11/30/2006 2140    246        ATP III CLASSIFICATION:  <200     mg/dL   Desirable  200-239  mg/dL   Borderline High  >=240    mg/dL   High   TRIG 164 Performed at [facility] (H) 11/30/2006 2140   HDL 38 Performed at [facility] (L) 11/30/2006 2140   CHOLHDL 6.5 Performed at [facility] 11/30/2006 2140   VLDL 33 Performed at [facility] 11/30/2006 2140   [place] (H) 11/30/2006 2140    175        Total Cholesterol/HDL:CHD Risk Coronary Heart Disease Risk Table                     Men   Women  1/2 Average Risk   3.4   3.3     Imaging I have reviewed images in epic and the results pertinent to this consultation are: CT and MRI reviewed yesterday.  Repeat CT to check for stability remains unchanged at 10:07 AM today  Assessment: 68 year old man past history of hypertension, hyperlipidemia, diabetes, right ICA stenosis presented with sudden onset of slurred speech and difficulty walking along with left facial droop yesterday. Came in as a code stroke- CT and MRI negative for an acute infarct. 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 He had a full-blown generalized tonic-clonic seizure lasting around 2 minutes today. No Ativan required After that was loaded with Keppra and continued on  Keppra.  Impression: Subdural hemorrhage Seizure-likely provoked from the subdural hematoma  Recommendations: Continue Keppra 500 twice daily Continue seizure precautions He is getting evaluation for his high-grade right carotid stenosis from vascular surgery-defer to primary team and vascular surgery. I do not see a need for doing an EEG as he is back to baseline and etiology seems to be obvious from the subdural hematoma. Please call us as needed.  -- Amie Portland, MD [facility] Pager: [phone] If 7pm to 7am, please call on call as listed on AMION.

## 2018-07-28 DIAGNOSIS — R4781 Slurred speech: Secondary | ICD-10-CM

## 2018-07-28 LAB — CBC
HCT: 39.8 % (ref 39.0–52.0)
Hemoglobin: 13.4 g/dL (ref 13.0–17.0)
MCH: 31.5 pg (ref 26.0–34.0)
MCHC: 33.7 g/dL (ref 30.0–36.0)
MCV: 93.4 fL (ref 80.0–100.0)
Platelets: 200 10*3/uL (ref 150–400)
RBC: 4.26 MIL/uL (ref 4.22–5.81)
RDW: 12.7 % (ref 11.5–15.5)
WBC: 8.6 10*3/uL (ref 4.0–10.5)
nRBC: 0 % (ref 0.0–0.2)

## 2018-07-28 LAB — COMPREHENSIVE METABOLIC PANEL WITH GFR
ALT: 18 U/L (ref 0–44)
AST: 16 U/L (ref 15–41)
Albumin: 3.4 g/dL — ABNORMAL LOW (ref 3.5–5.0)
Alkaline Phosphatase: 29 U/L — ABNORMAL LOW (ref 38–126)
Anion gap: 8 (ref 5–15)
BUN: 34 mg/dL — ABNORMAL HIGH (ref 8–23)
CO2: 21 mmol/L — ABNORMAL LOW (ref 22–32)
Calcium: 8.6 mg/dL — ABNORMAL LOW (ref 8.9–10.3)
Chloride: 112 mmol/L — ABNORMAL HIGH (ref 98–111)
Creatinine, Ser: 2.6 mg/dL — ABNORMAL HIGH (ref 0.61–1.24)
GFR calc Af Amer: 28 mL/min — ABNORMAL LOW
GFR calc non Af Amer: 24 mL/min — ABNORMAL LOW
Glucose, Bld: 111 mg/dL — ABNORMAL HIGH (ref 70–99)
Potassium: 4.3 mmol/L (ref 3.5–5.1)
Sodium: 141 mmol/L (ref 135–145)
Total Bilirubin: 0.9 mg/dL (ref 0.3–1.2)
Total Protein: 5.6 g/dL — ABNORMAL LOW (ref 6.5–8.1)

## 2018-07-28 LAB — GLUCOSE, CAPILLARY: Glucose-Capillary: 100 mg/dL — ABNORMAL HIGH (ref 70–99)

## 2018-07-28 MED ORDER — ONDANSETRON 4 MG PO TBDP: 4.0000 mg | ORAL_TABLET | Freq: Three times a day (TID) | ORAL | 0 refills | Status: DC | PRN

## 2018-07-28 MED ORDER — LISINOPRIL 20 MG PO TABS: 20.0000 mg | ORAL_TABLET | Freq: Every day | ORAL | Status: DC

## 2018-07-28 MED ORDER — ATENOLOL 25 MG PO TABS: 25.0000 mg | ORAL_TABLET | Freq: Two times a day (BID) | ORAL | Status: DC

## 2018-07-28 MED ORDER — LEVETIRACETAM 500 MG PO TABS: 500.0000 mg | ORAL_TABLET | Freq: Two times a day (BID) | ORAL | 4 refills | Status: DC

## 2018-07-28 NOTE — Discharge Summary (Signed)
Physician Discharge Summary   Patient ID: Johnny [name] MRN: [id number] DOB/AGE: [birth year] 68 y.o.  Admit date: 07/26/2018 Discharge date: 07/28/2018  Primary Care Physician:  Default, Provider, MD   Recommendations for Outpatient Follow-up:  1. Aspirin on hold 2. Placed on Keppra 500mg  BID 3. Wellbutrin discontinued due to risk factors of lowered seizure threshold  Home Health: None, patient was seen by PT, recommended 24/7 supervision, no PT follow-up needed.  Discussed with patient's niece.  Patient lives with niece and her father. Equipment/Devices: None  Discharge Condition: stable  CODE STATUS: FULL  Diet recommendation: Carb modified diet   Discharge Diagnoses:    . Subdural hematoma ([place]) Right internal carotid artery stenosis Essential hypertension Seizures Diabetes mellitus type 2 Anxiety  Consults:  Neurosurgery Neurology Vascular surgery    Allergies:  No Known Allergies   DISCHARGE MEDICATIONS: Allergies as of 07/28/2018   No Known Allergies     Medication List    STOP taking these medications   aspirin EC 81 MG tablet   buPROPion 75 MG tablet Commonly known as: WELLBUTRIN     TAKE these medications   acetaminophen 650 MG CR tablet Commonly known as: TYLENOL Take 1,300 mg by mouth 2 (two) times daily as needed for pain. Notes to patient: Resume home schedule 07/28/18   atenolol 25 MG tablet Commonly known as: TENORMIN Take 1 tablet (25 mg total) by mouth 2 (two) times daily. Start taking on: July 29, 2018 Notes to patient: Resume home schedule 07/28/18   atorvastatin 80 MG tablet Commonly known as: LIPITOR Take 40 mg by mouth daily. Notes to patient: Resume home schedule 07/28/18   CALCIUM PO Take 1 tablet by mouth daily with breakfast. Notes to patient: Resume home schedule 07/28/18   CINNAMON PO Take 1 capsule by mouth daily with breakfast. Notes to patient: Resume home schedule 07/28/18   insulin aspart 100 UNIT/ML  injection Commonly known as: novoLOG Inject 20-25 Units into the skin 2 (two) times daily before a meal. Breakfast and Supper (evening meal) Notes to patient: Resume home schedule 07/28/18   insulin glargine 100 UNIT/ML injection Commonly known as: LANTUS Inject 50 Units into the skin daily before breakfast. Notes to patient: Resume home schedule 07/28/18   INSULIN SYRINGE .5CC/30GX1/2" 30G X 1/2" 0.5 ML Misc Inject 1 Syringe into the skin 4 (four) times daily. Notes to patient: Resume home schedule 07/28/18   levETIRAcetam 500 MG tablet Commonly known as: Keppra Take 1 tablet (500 mg total) by mouth 2 (two) times daily. Notes to patient: Resume home schedule 07/28/18   levothyroxine 25 MCG tablet Commonly known as: SYNTHROID Take 25 mcg by mouth daily before breakfast. Notes to patient: Resume home schedule 07/29/18   lisinopril 20 MG tablet Commonly known as: ZESTRIL Take 1 tablet (20 mg total) by mouth daily. Start taking on: July 30, 2018 What changed: These instructions start on July 30, 2018. If you are unsure what to do until then, ask your doctor or other care provider. Notes to patient: Resume home schedule 07/30/18   ondansetron 4 MG disintegrating tablet Commonly known as: Zofran ODT Take 1 tablet (4 mg total) by mouth every 8 (eight) hours as needed for nausea or vomiting. Notes to patient: Resume home schedule 07/28/18   One-A-Day Mens 50+ Advantage Tabs Take 1 tablet by mouth daily with breakfast. Notes to patient: Resume home schedule 07/29/18   PRECISION XTRA TEST STRIPS test strip Generic drug: glucose blood 1 each by Other route as  needed (blood glucose level). Use as instructed   Vitamin D-3 25 MCG (1000 UT) Caps Take 1,000 Units by mouth daily with breakfast. Notes to patient: Resume home schedule 07/29/18        Brief H and P: For complete details please refer to admission H and P, but in briefPatient is a 68 year old male with history of diabetes,  hypertension, hyperlipidemia, known right carotid stenosis presented to ED initially as a code stroke.  Patient reported that he was in his usual state of health until 330pm on the day of admission, when his family member noticed that he was slurring his speech.  Patient reported that 24 hours prior to admission, he fell and hit his left side of the head and face while he was getting out of the car and slipped.  Patient had refused medical attention at that time.  On the day of admission, he had sudden change in his speech, was seen by EMS and found to have left facial droop, dysarthria. CT head showed bilateral subdural hygromas or chronic subdural hematomas, acute thrombus left subdural space.  Neurology was consulted.  MRI brain showed no acute intracranial infarct, chronic bilateral subdural collections, acute/subacute hemorrhage in the left posterior parietal region without mass-effect.  Neurosurgery was consulted.  Patient was admitted for further work-up.  Hospital Course:   Subdural hematoma (HCC) -Likely due to the fall.  Patient presented initially with the code stroke as slurred speech, facial droop. -MRI of the brain showed no acute stroke, chronic bilateral subdural collections, acute/subacute hemorrhage in the left posterior parietal region without mass-effect. -Neurosurgery was consulted, recommended repeat CT head which is stable and Keppra for prophylaxis. -Subsequently during hospitalization patient had 2 minutes witnessed generalized seizure.  Patient received IV Keppra loading dose, and placed on Keppra 500 mg twice daily.  Seizures During hospitalization patient had 2 minutes witnessed generalized seizure, received IV Keppra loading dose, subsequently placed on Keppra 500 mg twice daily -Patient will follow-up with Dr. Annette Stable from neurosurgery.  Patient was initially recommended Keppra for 2 weeks for prophylaxis however after seizure episode, recommended to continue until advised to  stop by MD.  Patient was given advice for seizure precautions, no driving for 6 months until cleared by neurology or neurosurgery.     Internal carotid artery stenosis, right -CT angiogram of the head and neck showed high-grade stenosis of the proximal right ICA 75 to 90%, potentially greater. -Vascular surgery consulted, discussed with Dr. Carlis Abbott.  Challenging situation as patient has acute/subacute brain bleed and would likely need anticoagulation with intervention for right ICA stenosis.  -Patient will follow-up with Dr. Carlis Abbott in 2 to 3 weeks regarding right ICA stenosis intervention    HTN (hypertension) -BP was somewhat soft during hospitalization and patient was placed on gentle hydration. -He is recommended to hold his antihypertensives for 1 or 2 days more and check his BP daily.    Type 2 diabetes mellitus (HCC) -Continue insulin regimen per outpatient dose  Anxiety Wellbutrin discontinued due to lowered seizure threshold  Day of Discharge S: No repeat seizures after starting Keppra.  Patient feels back to baseline.  Wants to go home.  BP 129/63 (BP Location: Left Arm)   Pulse 75   Temp 97.7 F (36.5 C) (Rectal)   Resp 18   Ht 5\' 8"  (1.727 m)   Wt 92.2 kg   SpO2 94%   BMI 30.91 kg/m   Physical Exam: General: Alert and awake oriented x3 not in any acute  distress. HEENT: Abrasions on the left side of the face and cheek, petechial subconjunctival hemorrhage in the left eye CVS: S1-S2 clear no murmur rubs or gallops Chest: clear to auscultation bilaterally, no wheezing rales or rhonchi Abdomen: soft nontender, nondistended, normal bowel sounds Extremities: no cyanosis, clubbing or edema noted bilaterally Neuro: Cranial nerves II-XII intact, no focal neurological deficits   The results of significant diagnostics from this hospitalization (including imaging, microbiology, ancillary and laboratory) are listed below for reference.      Procedures/Studies:  Ct Angio  Head W Or Wo Contrast  Result Date: 07/26/2018 CLINICAL DATA:  Unsteady gait. Slurred speech. EXAM: CT ANGIOGRAPHY HEAD AND NECK TECHNIQUE: Multidetector CT imaging of the head and neck was performed using the standard protocol during bolus administration of intravenous contrast. Multiplanar CT image reconstructions and MIPs were obtained to evaluate the vascular anatomy. Carotid stenosis measurements (when applicable) are obtained utilizing NASCET criteria, using the distal internal carotid diameter as the denominator. CONTRAST:  33mL OMNIPAQUE IOHEXOL 350 MG/ML SOLN COMPARISON:  Code stroke CT head earlier today. FINDINGS: CTA NECK FINDINGS Aortic arch: Standard branching. Imaged portion shows no evidence of aneurysm or dissection. No significant stenosis of the major arch vessel origins. Aortic atherosclerosis. Right carotid system: Severe proximal RIGHT ICA stenosis, luminal narrowing to 1 mm, estimated 75-90%, possibly greater. This is related to calcific and noncalcified plaque in the proximal RIGHT ICA. No dissection, although the cervical ICA on the RIGHT is decreased in caliber reflecting the high-grade proximal stenosis. Estimated 50-75% stenosis of the proximal ECA. Nonstenotic plaque mid common carotid artery. Left carotid system: No evidence of dissection, stenosis (50% or greater) or occlusion. Calcific and noncalcified plaque. Vertebral arteries: Codominant. No evidence of dissection, stenosis (50% or greater) or occlusion. Skeleton: Reported separately. Other neck: No masses. Upper chest: No pneumothorax or nodule. Review of the MIP images confirms the above findings CTA HEAD FINDINGS Anterior circulation: Calcification of the cavernous internal carotid arteries consistent with cerebrovascular atherosclerotic disease. No signs of intracranial large vessel occlusion. Posterior circulation: No significant stenosis, proximal occlusion, aneurysm, or vascular malformation. Venous sinuses: As permitted  by contrast timing, patent. Anatomic variants: None of significance Delayed phase: Not performed. Review of the MIP images confirms the above findings IMPRESSION: 1. 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 Vascular surgical consultation is warranted, given the reduced caliber of the cervical ICA. 2. Non stenotic atheromatous change LEFT carotid bifurcation. 3. No intracranial large vessel occlusion. 4. See additional significant findings noncontrast CT scan related to subdural fluid collections and focal LEFT parietal subdural clot. 5.  Aortic Atherosclerosis (ICD10-I70.0). Electronically Signed   By: Staci Righter M.D.   On: 07/26/2018 17:36   Ct Head Wo Contrast  Result Date: 07/27/2018 CLINICAL DATA:  Fall yesterday. Head injury. Follow-up subdural hemorrhage. EXAM: CT HEAD WITHOUT CONTRAST TECHNIQUE: Contiguous axial images were obtained from the base of the skull through the vertex without intravenous contrast. COMPARISON:  CT and MRI head 07/26/2018 FINDINGS: Brain: Small high-density subdural hemorrhage in the left parietal convexity is unchanged. No new hemorrhage. Moderately large low-density subdural fluid collections bilaterally are stable and likely due to chronic subdural hemorrhage/subdural hygroma. No midline shift. Generalized atrophy without hydrocephalus. No acute ischemic infarct or mass. Vascular: Negative for hyperdense vessel. Atherosclerotic calcification cavernous carotid bilaterally. Skull: Negative Sinuses/Orbits: Paranasal sinuses clear. Small mastoid effusion bilaterally unchanged. 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 Small amount of acute hemorrhage in the left parietal subdural  space is unchanged. No midline shift. Electronically Signed   By: Franchot Gallo M.D.   On: 07/27/2018 10:20   Ct Angio Neck W Or Wo Contrast  Result Date: 07/26/2018 CLINICAL DATA:  Unsteady gait. Slurred  speech. EXAM: CT ANGIOGRAPHY HEAD AND NECK TECHNIQUE: Multidetector CT imaging of the head and neck was performed using the standard protocol during bolus administration of intravenous contrast. Multiplanar CT image reconstructions and MIPs were obtained to evaluate the vascular anatomy. Carotid stenosis measurements (when applicable) are obtained utilizing NASCET criteria, using the distal internal carotid diameter as the denominator. CONTRAST:  52mL OMNIPAQUE IOHEXOL 350 MG/ML SOLN COMPARISON:  Code stroke CT head earlier today. FINDINGS: CTA NECK FINDINGS Aortic arch: Standard branching. Imaged portion shows no evidence of aneurysm or dissection. No significant stenosis of the major arch vessel origins. Aortic atherosclerosis. Right carotid system: Severe proximal RIGHT ICA stenosis, luminal narrowing to 1 mm, estimated 75-90%, possibly greater. This is related to calcific and noncalcified plaque in the proximal RIGHT ICA. No dissection, although the cervical ICA on the RIGHT is decreased in caliber reflecting the high-grade proximal stenosis. Estimated 50-75% stenosis of the proximal ECA. Nonstenotic plaque mid common carotid artery. Left carotid system: No evidence of dissection, stenosis (50% or greater) or occlusion. Calcific and noncalcified plaque. Vertebral arteries: Codominant. No evidence of dissection, stenosis (50% or greater) or occlusion. Skeleton: Reported separately. Other neck: No masses. Upper chest: No pneumothorax or nodule. Review of the MIP images confirms the above findings CTA HEAD FINDINGS Anterior circulation: Calcification of the cavernous internal carotid arteries consistent with cerebrovascular atherosclerotic disease. No signs of intracranial large vessel occlusion. Posterior circulation: No significant stenosis, proximal occlusion, aneurysm, or vascular malformation. Venous sinuses: As permitted by contrast timing, patent. Anatomic variants: None of significance Delayed phase: Not  performed. Review of the MIP images confirms the above findings IMPRESSION: 1. 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 Vascular surgical consultation is warranted, given the reduced caliber of the cervical ICA. 2. Non stenotic atheromatous change LEFT carotid bifurcation. 3. No intracranial large vessel occlusion. 4. See additional significant findings noncontrast CT scan related to subdural fluid collections and focal LEFT parietal subdural clot. 5.  Aortic Atherosclerosis (ICD10-I70.0). Electronically Signed   By: Staci Righter M.D.   On: 07/26/2018 17:36   Mr Brain Wo Contrast  Result Date: 07/26/2018 CLINICAL DATA:  Initial evaluation for acute slurred speech, unsteady gait, left-sided facial droop, possible subdural hematoma. EXAM: MRI HEAD WITHOUT CONTRAST TECHNIQUE: Multiplanar, multiecho pulse sequences of the brain and surrounding structures were obtained without intravenous contrast. COMPARISON:  Prior CT and CTA from earlier same day. FINDINGS: Brain: Moderately advanced cerebral volume loss for age. Patchy T2/FLAIR hyperintensity within the periventricular and deep white matter both cerebral hemispheres most consistent with chronic small vessel ischemic disease, mild in nature. Superimposed remote lacunar infarct present at the right basal ganglia. Small chronic cortical and subcortical infarcts seen involving the anterior left frontal lobe (series 15, image 18). Associated chronic hemosiderin staining. No abnormal foci of restricted diffusion to suggest acute or subacute ischemia. Gray-white matter differentiation maintained. No other areas of chronic cortical infarction. No mass lesion or midline shift. Ventricles normal in size without hydrocephalus. Bilateral extra-axial subdural collections again seen, measuring up to approximately 3 mm bilaterally. Collection on the right is largely chronic in appearance, and could reflect a chronic subdural  hematoma versus hygroma. Scattered acute blood products present within the left extra-axial collection, with focus of thrombus  measuring 2.4 cm overlying the left parietal convexity (series 16, image 35). Pituitary gland suprasellar region within normal limits. Midline structures intact and normal. Vascular: Major intracranial vascular flow voids are maintained. Skull and upper cervical spine: Craniocervical junction within normal limits. Upper cervical spine normal. Bone marrow signal intensity within normal limits. No scalp soft tissue abnormality. Sinuses/Orbits: Globes and orbital soft tissues within normal limits. Paranasal sinuses are largely clear. Small bilateral mastoid effusions, right slightly larger than left, of doubtful significance. Inner ear structures normal. Other: None. IMPRESSION: 1. No acute intracranial infarct identified. 2. Bilateral subdural collections, chronic in appearance on the right, either representing a chronic subdural hematoma or hygroma. Collection on the left is acute on chronic in appearance with scattered internal acute blood products, stable from prior CT. 3. Small chronic left frontal lobe infarct. 4. Underlying moderately advanced cerebral atrophy with mild chronic microvascular ischemic disease. Electronically Signed   By: Jeannine Boga M.D.   On: 07/26/2018 18:05   Ct C-spine No Charge  Result Date: 07/26/2018 CLINICAL DATA:  Slurred speech. Fall. Subdural fluid collections. EXAM: CT CERVICAL SPINE WITHOUT CONTRAST TECHNIQUE: Multidetector CT imaging of the cervical spine was performed without intravenous contrast. Multiplanar CT image reconstructions were also generated. COMPARISON:  None. FINDINGS: Alignment: Physiologic. Skull base and vertebrae: No acute fracture. No primary bone lesion or focal pathologic process. Soft tissues and spinal canal: No prevertebral fluid or swelling. No visible canal hematoma. Disc levels: Intervertebral disc spaces are largely  preserved. There is no stenosis or foraminal narrowing. Upper chest: Negative. Other: None IMPRESSION: Negative exam.  No cervical spine fracture or traumatic subluxation. Electronically Signed   By: Staci Righter M.D.   On: 07/26/2018 17:39   Ct Head Code Stroke Wo Contrast  Result Date: 07/26/2018 CLINICAL DATA:  Code stroke.  Slurred speech. Unsteady gait. EXAM: CT HEAD WITHOUT CONTRAST TECHNIQUE: Contiguous axial images were obtained from the base of the skull through the vertex without intravenous contrast. COMPARISON:  CTA head neck reported separately. FINDINGS: Brain: BILATERAL low attenuation extra-axial fluid collections, representing either subdural hygromas or chronic subdural hematomas, 15-16 mm thick, flattening of the cortical sulci. Additional acute subdural thrombus 12 x 17 mm, over the RIGHT anterior parietal lobe hyperattenuating. No parenchymal hemorrhage. Generalized atrophy. Hypoattenuation of white matter. No hydrocephalus, or visible acute infarct. Vascular: Calcification of the cavernous internal carotid arteries consistent with cerebrovascular atherosclerotic disease. No signs of intracranial large vessel occlusion. Skull: No skull fracture. Sinuses/Orbits: No acute findings. Other: None. ASPECTS [facility] Stroke Program Early CT Score) - Ganglionic level infarction (caudate, lentiform nuclei, internal capsule, insula, M1-M3 cortex): 7 - Supraganglionic infarction (M4-M6 cortex): 3 Total score (0-10 with 10 being normal): 10 IMPRESSION: 1. BILATERAL low attenuation extra-axial fluid collections representing either subdural hygromas or chronic subdural hematomas. 2. ASPECTS is 10. 3. Acute thrombus, LEFT subdural space, 12 x 17 mm. Thrombolysis/tPA is contraindicated. 4. These results were called by telephone at the time of interpretation on 07/26/2018 at 5:07 pm to Dr. Rory Percy , who verbally acknowledged these results. Electronically Signed   By: Staci Righter M.D.   On: 07/26/2018 17:12        LAB RESULTS: Basic Metabolic Panel: Recent Labs  Lab 07/27/18 1305 07/28/18 0539  NA 135 141  K 6.0* 4.3  CL 107 112*  CO2 14* 21*  GLUCOSE 300* 111*  BUN 43* 34*  CREATININE 2.70* 2.60*  CALCIUM 9.0 8.6*  MG 2.4  --    Liver Function Tests:  Recent Labs  Lab 07/26/18 1649 07/28/18 0539  AST 26 16  ALT 25 18  ALKPHOS 37* 29*  BILITOT 0.8 0.9  PROT 7.1 5.6*  ALBUMIN 4.4 3.4*   No results for input(s): LIPASE, AMYLASE in the last 168 hours. No results for input(s): AMMONIA in the last 168 hours. CBC: Recent Labs  Lab 07/26/18 1649  07/27/18 0522 07/28/18 0539  WBC 11.3*  --  8.2 8.6  NEUTROABS 4.8  --   --   --   HGB 15.8   < > 15.0 13.4  HCT 48.0   < > 43.6 39.8  MCV 93.6  --  92.0 93.4  PLT 240  --  234 200   < > = values in this interval not displayed.   Cardiac Enzymes: No results for input(s): CKTOTAL, CKMB, CKMBINDEX, TROPONINI in the last 168 hours. BNP: Invalid input(s): POCBNP CBG: Recent Labs  Lab 07/27/18 2156 07/28/18 0619  GLUCAP 160* 100*      Disposition and Follow-up: Discharge Instructions    Diet Carb Modified   Complete by: As directed    Discharge instructions   Complete by: As directed    Please hold your BP medications today. You may restart atenolol on 07/29/18  if SBP is above 130.    Discharge instructions:  Patient advised of seizure precautions as follows: Per [facility] statutes, patients with seizures are not allowed to drive until they have been seizure-free for six months. Use caution when using heavy equipment or power tools. Avoid working on ladders or at heights. Take showers instead of baths. Ensure the water temperature is not too high on the home water heater. Do not go swimming alone. When caring for infants or small children, sit down when holding, feeding, or changing them to minimize risk of injury to the child in the event you have a seizure.   Also, Maintain good sleep hygiene. Avoid  alcohol.  -->Call 911 and bring the patient back to the ED if:  A. The seizure lasts longer than 5 minutes.  B. The patient doesn't awaken shortly after the seizure C. The patient has new problems such as difficulty seeing, speaking or moving D. The patient was injured during the seizure E. The patient has a temperature over 102 F (39C) F. The patient vomited and now is having trouble breathing   Increase activity slowly   Complete by: As directed        DISPOSITION: Home   DISCHARGE FOLLOW-UP Follow-up Information    Earnie Larsson, MD. Schedule an appointment as soon as possible for a visit in 2 week(s).   Specialty: Neurosurgery Contact information: [address] [place] [address] [phone]        Marty Heck, MD Follow up in 3 week(s).   Specialty: Vascular Surgery Contact information: [address] [place] [address] [phone]            Time coordinating discharge:  35 minutes  Signed:   Estill Cotta M.D. [facility] 07/28/2018, 1:24 PM

## 2018-07-28 NOTE — Progress Notes (Signed)
Patient given discharge teaching and handout, able to verbalize understanding and teach back. No new questions or concerns, IV and tele removed. Patient to discharge home, family to transport.

## 2018-07-28 NOTE — Progress Notes (Signed)
Patient with witnessed seizure yesterday.  Seizure resolved without medical therapy.  Patient now on antiepileptics.  No further seizures.  Follow-up head CT scan yesterday with stable bilateral subdural hygromas with a very small amount of left posterior parietal hemorrhage without mass-effect.  Patient has returned to his baseline postictally.  No new recommendations from my standpoint.  No indication for surgical intervention.

## 2018-07-28 NOTE — Progress Notes (Signed)
[facility]  Subjective  - Seizure event yesterday.  No additional neurologic events overnight.   Objective 129/63 75 97.7 F (36.5 C) (Rectal) 18 94%  Intake/Output Summary (Last 24 hours) at 07/28/2018 0929 Last data filed at 07/27/2018 1500 Gross per 24 hour  Intake 120 ml  Output -  Net 120 ml    Left eye and facial bruising with eccymosis following fall No other new neurologic deficits  Laboratory Lab Results: Recent Labs    07/27/18 0522 07/28/18 0539  WBC 8.2 8.6  HGB 15.0 13.4  HCT 43.6 39.8  PLT 234 200   BMET Recent Labs    07/27/18 1305 07/28/18 0539  NA 135 141  K 6.0* 4.3  CL 107 112*  CO2 14* 21*  GLUCOSE 300* 111*  BUN 43* 34*  CREATININE 2.70* 2.60*  CALCIUM 9.0 8.6*    COAG Lab Results  Component Value Date   INR 1.0 07/26/2018   INR 1.0 11/30/2006   No results found for: PTT  Assessment/Planning:  Followed up with patient again today to discuss his asymptomatic high-grade right ICA stenosis that was discovered incidentally during stroke work-up.  Ultimately the etiology for his presentation was likely traumatic with bilateral chronic hygromas and an acute small left brain bleed.  Yesterday when I was talking to him he had a seizure event.  On follow-up today patient is amendable to have his carotid surgery done here in [place].  I again discussed that after talking with Dr. Annette Stable yesterday he needs at least 2 weeks before we can safely anticoagulate him for carotid surgery.  I will plan to see him in clinic in 2 to 3 weeks just to ensure that he is recovering from this hospitalization without any issues and no additional seizure events.  At that time we can then make plans to proceed with elective right carotid artery revascularization and planned CEA-.  I will arrange clinic appointment and my office will contact him this week.  Marty Heck 07/28/2018 9:29 AM --

## 2018-07-29 LAB — HEMOGLOBIN A1C
Hgb A1c MFr Bld: 7.4 % — ABNORMAL HIGH (ref 4.8–5.6)
Mean Plasma Glucose: 166 mg/dL

## 2018-08-26 ENCOUNTER — Telehealth (HOSPITAL_COMMUNITY): Payer: Self-pay | Admitting: Rehabilitation

## 2018-08-26 NOTE — Telephone Encounter (Signed)

## 2018-08-27 ENCOUNTER — Other Ambulatory Visit: Payer: Self-pay

## 2018-08-27 ENCOUNTER — Ambulatory Visit (INDEPENDENT_AMBULATORY_CARE_PROVIDER_SITE_OTHER): Payer: Medicare Other | Admitting: Vascular Surgery

## 2018-08-27 ENCOUNTER — Encounter: Payer: Self-pay | Admitting: Vascular Surgery

## 2018-08-27 VITALS — BP 141/76 | HR 67 | Temp 98.0°F | Resp 18 | Ht 67.0 in | Wt 200.0 lb

## 2018-08-27 DIAGNOSIS — I6521 Occlusion and stenosis of right carotid artery: Secondary | ICD-10-CM

## 2018-08-27 NOTE — Progress Notes (Signed)
Patient name: Johnny [name] MRN: [id number] DOB: 1950-11-26 Sex: male  REASON FOR VISIT: Hospital follow-up to discuss right carotid endarterectomy  HPI: Johnny [name] is a 68 y.o. male with history of diabetes, hypertension, hyperlipidemia that vascular surgery was consulted in the hospital several weeks ago for high-grade right ICA stenosis.  Patient initially presented as a code stroke and after extensive work-up was found to have bilateral chronic hygromas as well as a small acute left parietal bleed.  Patient reported a fall and hit his head prior to arrival.  Then had slurred speech and seizure activity.  On subsequent work-up there was no evidence of ischemic stroke but there was incidental finding of high-grade right ICA stenosis.  Vascular surgery was subsequently consulted and neurosurgery.  Patient reports no more seizures since discharge.  He reports no focal neurologic symptoms.  He has recovered from his fall.  As previously noted states his carotids have been followed by the [facility] in [place].  No head or neck surgery.  Past Medical History:  Diagnosis Date  . Diabetes mellitus without complication ([place])   . Hyperlipemia   . Hypertension     Past Surgical History:  Procedure Laterality Date  . APPENDECTOMY      Family History  Family history unknown: Yes    SOCIAL HISTORY: Social History   Tobacco Use  . Smoking status: Never Smoker  . Smokeless tobacco: Never Used  Substance Use Topics  . Alcohol use: No    No Known Allergies  Current Outpatient Medications  Medication Sig Dispense Refill  . acetaminophen (TYLENOL) 650 MG CR tablet Take 1,300 mg by mouth 2 (two) times daily as needed for pain.    Marland Kitchen atenolol (TENORMIN) 25 MG tablet Take 1 tablet (25 mg total) by mouth 2 (two) times daily.    Marland Kitchen CALCIUM PO Take 1 tablet by mouth daily with breakfast.    . Cholecalciferol (VITAMIN D-3) 25 MCG (1000 UT) CAPS Take 1,000 Units by mouth daily with breakfast.     . CINNAMON PO Take 1 capsule by mouth daily with breakfast.    . glucose blood (PRECISION XTRA TEST STRIPS) test strip 1 each by Other route as needed (blood glucose level). Use as instructed    . insulin aspart (NOVOLOG) 100 UNIT/ML injection Inject 20-25 Units into the skin 2 (two) times daily before a meal. Breakfast and Supper (evening meal)    . insulin glargine (LANTUS) 100 UNIT/ML injection Inject 50 Units into the skin daily before breakfast.     . Insulin Syringe-Needle U-100 (INSULIN SYRINGE .5CC/30GX1/2") 30G X 1/2" 0.5 ML MISC Inject 1 Syringe into the skin 4 (four) times daily.    Marland Kitchen levETIRAcetam (KEPPRA) 500 MG tablet Take 1 tablet (500 mg total) by mouth 2 (two) times daily. 60 tablet 4  . Multiple Vitamins-Minerals (ONE-A-DAY MENS 50+ ADVANTAGE) TABS Take 1 tablet by mouth daily with breakfast.    . atorvastatin (LIPITOR) 80 MG tablet Take 40 mg by mouth daily.    Marland Kitchen levothyroxine (SYNTHROID) 25 MCG tablet Take 25 mcg by mouth daily before breakfast.    . lisinopril (ZESTRIL) 20 MG tablet Take 1 tablet (20 mg total) by mouth daily. (Patient not taking: Reported on 08/27/2018)    . ondansetron (ZOFRAN ODT) 4 MG disintegrating tablet Take 1 tablet (4 mg total) by mouth every 8 (eight) hours as needed for nausea or vomiting. (Patient not taking: Reported on 08/27/2018) 20 tablet 0   No current facility-administered  medications for this visit.     REVIEW OF SYSTEMS:  [X]  denotes positive finding, [ ]  denotes negative finding Cardiac  Comments:  Chest pain or chest pressure:    Shortness of breath upon exertion:    Short of breath when lying flat:    Irregular heart rhythm:        Vascular    Pain in calf, thigh, or hip brought on by ambulation:    Pain in feet at night that wakes you up from your sleep:     Blood clot in your veins:    Leg swelling:         Pulmonary    Oxygen at home:    Productive cough:     Wheezing:         Neurologic    Sudden weakness in arms or  legs:     Sudden numbness in arms or legs:     Sudden onset of difficulty speaking or slurred speech:    Temporary loss of vision in one eye:     Problems with dizziness:         Gastrointestinal    Blood in stool:     Vomited blood:         Genitourinary    Burning when urinating:     Blood in urine:        Psychiatric    Major depression:         Hematologic    Bleeding problems:    Problems with blood clotting too easily:        Skin    Rashes or ulcers:        Constitutional    Fever or chills:      PHYSICAL EXAM: Vitals:   08/27/18 1032 08/27/18 1037  BP: 139/75 (!) 141/76  Pulse: 67 67  Resp: 18   Temp: 98 F (36.7 C)   TempSrc: Temporal   SpO2: 97%   Weight: 200 lb (90.7 kg)   Height: 5\' 7"  (1.702 m)     GENERAL: The patient is a well-nourished male, in no acute distress. The vital signs are documented above. CARDIAC: There is a regular rate and rhythm.  PULMONARY: There is good air exchange bilaterally without wheezing or rales. ABDOMEN: Soft and non-tender with normal pitched bowel sounds.  MUSCULOSKELETAL: There are no major deformities or cyanosis. NEUROLOGIC: No focal weakness or paresthesias are detected.  CN II-XII grossly intact. SKIN: There are no ulcers or rashes noted. PSYCHIATRIC: The patient has a normal affect.  DATA:   I independently reviewed his CTA from the hospital and again this shows a high-grade focal right ICA stenosis greater than 80%.  Assessment/Plan:  68 year old male presents for follow-up to discuss high-grade asymptomatic right carotid stenosis.  He has recovered from his hospitalization with no more seizure activity and now it has been 2 weeks since his fall with a small brain bleed.  He should be okay for anticoagulation per Dr. Annette Stable.  I did offer him a right carotid endarterectomy for stroke reduction.  I discussed this operation in detail as well as risks and benefits.  Ultimately he is concerned about cost and is  currently in the [facility] system.  He thinks that he can have his surgery done at the [facility] in [place].  I subsequently provided my business card and discussed that he could schedule surgery here if he changes his mind.  He would like to further discuss options with his [facility]  in [place].   Marty Heck, MD [facility] Office: [phone] Pager: [phone]

## 2018-08-28 ENCOUNTER — Other Ambulatory Visit: Payer: Self-pay | Admitting: Neurosurgery

## 2018-08-28 DIAGNOSIS — S065X9A Traumatic subdural hemorrhage with loss of consciousness of unspecified duration, initial encounter: Secondary | ICD-10-CM

## 2018-08-28 DIAGNOSIS — S065XAA Traumatic subdural hemorrhage with loss of consciousness status unknown, initial encounter: Secondary | ICD-10-CM

## 2018-09-16 ENCOUNTER — Other Ambulatory Visit: Payer: Medicare Other

## 2018-09-23 ENCOUNTER — Ambulatory Visit
Admission: RE | Admit: 2018-09-23 | Discharge: 2018-09-23 | Disposition: A | Discharge status: Home / Self Care | Payer: Medicare Other | Source: Ambulatory Visit | Attending: Neurosurgery | Admitting: Neurosurgery

## 2018-09-23 DIAGNOSIS — S065XAA Traumatic subdural hemorrhage with loss of consciousness status unknown, initial encounter: Secondary | ICD-10-CM

## 2018-09-23 DIAGNOSIS — S065X9A Traumatic subdural hemorrhage with loss of consciousness of unspecified duration, initial encounter: Secondary | ICD-10-CM

## 2019-05-15 ENCOUNTER — Other Ambulatory Visit: Payer: Self-pay | Admitting: *Deleted

## 2019-05-15 DIAGNOSIS — I6521 Occlusion and stenosis of right carotid artery: Secondary | ICD-10-CM

## 2019-05-16 ENCOUNTER — Telehealth (HOSPITAL_COMMUNITY): Payer: Self-pay

## 2019-05-16 NOTE — Telephone Encounter (Signed)

## 2019-05-20 ENCOUNTER — Other Ambulatory Visit: Payer: Self-pay

## 2019-05-20 ENCOUNTER — Ambulatory Visit (HOSPITAL_COMMUNITY)
Admission: RE | Admit: 2019-05-20 | Discharge: 2019-05-20 | Disposition: A | Discharge status: Home / Self Care | Payer: No Typology Code available for payment source | Source: Ambulatory Visit | Attending: Surgery | Admitting: Surgery

## 2019-05-20 ENCOUNTER — Encounter: Payer: Self-pay | Admitting: Vascular Surgery

## 2019-05-20 ENCOUNTER — Ambulatory Visit (INDEPENDENT_AMBULATORY_CARE_PROVIDER_SITE_OTHER): Payer: No Typology Code available for payment source | Admitting: Vascular Surgery

## 2019-05-20 VITALS — BP 137/79 | HR 85 | Temp 97.9°F | Resp 18 | Ht 67.0 in | Wt 198.0 lb

## 2019-05-20 DIAGNOSIS — I6521 Occlusion and stenosis of right carotid artery: Secondary | ICD-10-CM

## 2019-05-20 NOTE — Progress Notes (Signed)
Patient name: Johnny [name] MRN: [id number] DOB: 27-Jul-1950 Sex: male  REASON FOR VISIT: Re-evaluate for right carotid endarterectomy  HPI: Johnny [name] is a 69 y.o. male with history of diabetes, hypertension, hyperlipidemia that presents back as a referral back from the [facility] for evaluation of right carotid endarterectomy.  Patient was initially seen last year in June 2020.  We were consulted in the hospital for a high-grade right ICA stenosis.  He initially presented as a code stroke and was found to have chronic bilateral hygromas and an acute left parietal bleed.  Apparently fell and hit his head prior to this.  He was seen by neurosurgery and they had recommended delaying intervention for several weeks.  On follow-up last year we recommended right carotid endarterectomy but he wanted to delay intervention and get back to the [facility].  He denies any previous neck surgeries or neck radiation.  He has had no neurologic events over the last 6 months.  Would like to have his carotid surgery here now.  Past Medical History:  Diagnosis Date  . Diabetes mellitus without complication ([place])   . Hyperlipemia   . Hypertension     Past Surgical History:  Procedure Laterality Date  . APPENDECTOMY      Family History  Family history unknown: Yes    SOCIAL HISTORY: Social History   Tobacco Use  . Smoking status: Never Smoker  . Smokeless tobacco: Never Used  Substance Use Topics  . Alcohol use: No    No Known Allergies  Current Outpatient Medications  Medication Sig Dispense Refill  . acetaminophen (TYLENOL) 650 MG CR tablet Take 1,300 mg by mouth 2 (two) times daily as needed for pain.    Marland Kitchen atorvastatin (LIPITOR) 80 MG tablet Take 40 mg by mouth daily.    Marland Kitchen CALCIUM PO Take 1 tablet by mouth daily with breakfast.    . Cholecalciferol (VITAMIN D-3) 25 MCG (1000 UT) CAPS Take 1,000 Units by mouth daily with breakfast.    . CINNAMON PO Take 1 capsule by mouth daily with breakfast.    .  glucose blood (PRECISION XTRA TEST STRIPS) test strip 1 each by Other route as needed (blood glucose level). Use as instructed    . insulin aspart (NOVOLOG) 100 UNIT/ML injection Inject 20-25 Units into the skin 2 (two) times daily before a meal. Breakfast and Supper (evening meal)    . insulin glargine (LANTUS) 100 UNIT/ML injection Inject 50 Units into the skin daily before breakfast.     . Insulin Syringe-Needle U-100 (INSULIN SYRINGE .5CC/30GX1/2") 30G X 1/2" 0.5 ML MISC Inject 1 Syringe into the skin 4 (four) times daily.    Marland Kitchen levETIRAcetam (KEPPRA) 500 MG tablet Take 1 tablet (500 mg total) by mouth 2 (two) times daily. 60 tablet 4  . levothyroxine (SYNTHROID) 25 MCG tablet Take 25 mcg by mouth daily before breakfast.    . lisinopril (ZESTRIL) 20 MG tablet Take 1 tablet (20 mg total) by mouth daily.    . Multiple Vitamins-Minerals (ONE-A-DAY MENS 50+ ADVANTAGE) TABS Take 1 tablet by mouth daily with breakfast.    . ondansetron (ZOFRAN ODT) 4 MG disintegrating tablet Take 1 tablet (4 mg total) by mouth every 8 (eight) hours as needed for nausea or vomiting. 20 tablet 0  . atenolol (TENORMIN) 25 MG tablet Take 1 tablet (25 mg total) by mouth 2 (two) times daily. (Patient not taking: Reported on 05/20/2019)     No current facility-administered medications for this visit.  REVIEW OF SYSTEMS:  [X]  denotes positive finding, [ ]  denotes negative finding Cardiac  Comments:  Chest pain or chest pressure:    Shortness of breath upon exertion:    Short of breath when lying flat:    Irregular heart rhythm:        Vascular    Pain in calf, thigh, or hip brought on by ambulation:    Pain in feet at night that wakes you up from your sleep:     Blood clot in your veins:    Leg swelling:         Pulmonary    Oxygen at home:    Productive cough:     Wheezing:         Neurologic    Sudden weakness in arms or legs:     Sudden numbness in arms or legs:     Sudden onset of difficulty speaking or  slurred speech:    Temporary loss of vision in one eye:     Problems with dizziness:         Gastrointestinal    Blood in stool:     Vomited blood:         Genitourinary    Burning when urinating:     Blood in urine:        Psychiatric    Major depression:         Hematologic    Bleeding problems:    Problems with blood clotting too easily:        Skin    Rashes or ulcers:        Constitutional    Fever or chills:      PHYSICAL EXAM: Vitals:   05/20/19 1333 05/20/19 1336  BP: 136/77 137/79  Pulse: 88 85  Resp: 18   Temp: 97.9 F (36.6 C)   TempSrc: Temporal   SpO2: 99%   Weight: 198 lb (89.8 kg)   Height: 5\' 7"  (1.702 m)     GENERAL: The patient is a well-nourished male, in no acute distress. The vital signs are documented above. CARDIAC: There is a regular rate and rhythm.  PULMONARY: There is good air exchange bilaterally without wheezing or rales. ABDOMEN: Soft and non-tender with normal pitched bowel sounds.  MUSCULOSKELETAL: There are no major deformities or cyanosis. NEUROLOGIC: No focal weakness or paresthesias are detected.  CN II-XII grossly intact. SKIN: There are no ulcers or rashes noted. PSYCHIATRIC: The patient has a normal affect.  DATA:   I independently reviewed his CTA neck from last year again this shows a high-grade focal right ICA stenosis greater than 80%.  Carotid duplex today shows a 60-79% right ICA stenosis with velocity of 351/70 and a left ICA stenosis of only 1-39%.  Assessment/Plan:  69 year old male presents for follow-up to discuss high-grade asymptomatic right carotid stenosis.  I previously offered him a right carotid endarterectomy last year and he wanted to defer and have this done at the [facility].  He is now back as a referral from the [facility] to see me again and states he now he wants his surgery to be done here.  His duplex today suggests a 60 to 79% right ICA stenosis.  However I re-reviewed his CTA from last year when we were  initially consulted and he clearly has a high-grade stenosis.  All of his imaging at the [facility] have also correlated high-grade stenosis.  I have offered right carotid endarterectomy.  We discussed risk and benefits of surgery  in detail including risk of bleeding, infection, cranial nerve injury,1% risk peri-operative stroke..  We will get him scheduled.  He is very worried about the cost and I have spoken to our team to ensure he gets approval from the [facility] prior to surgery.   Marty Heck, MD [facility] Office: [phone]

## 2019-06-11 ENCOUNTER — Inpatient Hospital Stay (HOSPITAL_COMMUNITY): Admission: RE | Admit: 2019-06-11 | Payer: No Typology Code available for payment source | Source: Ambulatory Visit

## 2019-06-12 ENCOUNTER — Encounter (HOSPITAL_COMMUNITY): Payer: Self-pay | Admitting: Certified Registered Nurse Anesthetist

## 2019-06-12 ENCOUNTER — Telehealth: Payer: Self-pay | Admitting: Vascular Surgery

## 2019-06-12 NOTE — Telephone Encounter (Signed)
69 year old male who was scheduled for carotid endarterectomy with me tomorrow.  Apparently contacted our office and notified one of our nurses that he does not want surgery and would rather not continue living life.  I tried to contact him multiple times both through contact numbers in the chart as well as family members unsuccessful.  We will cancel surgery for tomorrow.  We will ask the health department to do a wellness check on him.  Marty Heck, MD [facility] Office: [place]

## 2019-06-13 ENCOUNTER — Inpatient Hospital Stay (HOSPITAL_COMMUNITY)
Admission: RE | Admit: 2019-06-13 | Payer: No Typology Code available for payment source | Source: Home / Self Care | Admitting: Vascular Surgery

## 2019-06-13 ENCOUNTER — Encounter (HOSPITAL_COMMUNITY): Admission: RE | Payer: Self-pay | Source: Home / Self Care

## 2019-06-13 SURGERY — ENDARTERECTOMY, CAROTID
Anesthesia: General | Laterality: Right

## 2019-08-27 ENCOUNTER — Telehealth: Payer: Self-pay

## 2019-08-27 NOTE — Telephone Encounter (Signed)
-----   Message from Marty Heck, MD sent at 08/27/2019  2:19 PM EDT ----- Regarding: RE: Surgery request He can be scheduled to see me in the office and discuss - I do not need any studies.  I'm not re-scheduling him given he has cancelled on multiple previous occasions.  Thanks,  Gerald Stabs ----- Message ----- From: Nicholas Lose, RN Sent: 08/27/2019   1:22 PM EDT To: Marty Heck, MD Subject: Surgery request                                Pt called office requesting to schedule surgery. He was scheduled for a Rt CEA on 06/13/19 and called the day before to cancel stating no will to live. Last OV 05/20/19. Please advise. Thanks [employer] provided:  [phone]

## 2019-08-27 NOTE — Telephone Encounter (Signed)
Attempted to reach pt at both numbers provided in previous message to advise of Dr. Anell Barr recommendations and schedule appt. Left message for pt to return call.

## 2019-08-29 ENCOUNTER — Encounter: Payer: Self-pay | Admitting: Vascular Surgery

## 2019-08-29 NOTE — Telephone Encounter (Signed)
Mr. Bartell called today.  Attempted to call him back.  Voicemail full.  Schedule appointment with Dr. Carlis Abbott on August 17.  Will send patient a letter to advise.  Samara Stankowski [employer]

## 2019-08-29 NOTE — Telephone Encounter (Signed)
Scheduled office visit with Dr. Carlis Abbott on August 17.  No answer at the telephone

## 2019-09-02 ENCOUNTER — Telehealth: Payer: Self-pay

## 2019-09-02 NOTE — Telephone Encounter (Signed)
Pt called triage line; I have attempted to return his call several times this morning. His voice mailbox is full.

## 2019-09-23 ENCOUNTER — Other Ambulatory Visit: Payer: Self-pay | Admitting: *Deleted

## 2019-09-23 ENCOUNTER — Other Ambulatory Visit: Payer: Self-pay

## 2019-09-23 ENCOUNTER — Other Ambulatory Visit (HOSPITAL_COMMUNITY): Payer: Self-pay

## 2019-09-23 ENCOUNTER — Encounter: Payer: Self-pay | Admitting: Vascular Surgery

## 2019-09-23 ENCOUNTER — Ambulatory Visit (HOSPITAL_COMMUNITY)
Admission: RE | Admit: 2019-09-23 | Discharge: 2019-09-23 | Disposition: A | Discharge status: Home / Self Care | Payer: No Typology Code available for payment source | Source: Ambulatory Visit | Attending: Vascular Surgery | Admitting: Vascular Surgery

## 2019-09-23 ENCOUNTER — Encounter: Payer: Self-pay | Admitting: *Deleted

## 2019-09-23 ENCOUNTER — Ambulatory Visit (INDEPENDENT_AMBULATORY_CARE_PROVIDER_SITE_OTHER): Payer: Medicare Other | Admitting: Vascular Surgery

## 2019-09-23 VITALS — BP 116/68 | HR 79 | Temp 97.3°F | Resp 18 | Ht 67.0 in | Wt 200.0 lb

## 2019-09-23 DIAGNOSIS — I6521 Occlusion and stenosis of right carotid artery: Secondary | ICD-10-CM | POA: Diagnosis not present

## 2019-09-23 DIAGNOSIS — I6529 Occlusion and stenosis of unspecified carotid artery: Secondary | ICD-10-CM | POA: Diagnosis not present

## 2019-09-23 DIAGNOSIS — E119 Type 2 diabetes mellitus without complications: Secondary | ICD-10-CM | POA: Insufficient documentation

## 2019-09-23 DIAGNOSIS — Z01818 Encounter for other preprocedural examination: Secondary | ICD-10-CM | POA: Diagnosis not present

## 2019-09-23 NOTE — Progress Notes (Signed)
Patient name: Johnny [name] MRN: [id number] DOB: 1950/03/04 Sex: male  REASON FOR VISIT: Re-evaluate for right carotid endarterectomy after recently cancelling surgery  HPI: Johnny [name] is a 69 y.o. male with history of diabetes, hypertension, hyperlipidemia that presents to discuss previously planned right carotid endarterectomy.  He was initially referred back from the [facility] earlier this year for evaluation of right carotid endarterectomy.  Patient was initially seen last year in June 2020.  We were consulted in the hospital for a high-grade right ICA stenosis that was asymptomatic.  He initially presented as a code stroke and was found to have chronic bilateral hygromas and an acute left parietal bleed.  Apparently fell and hit his head prior to this.  He was seen by neurosurgery and they had recommended delaying intervention for several weeks.  On follow-up last year we recommended right carotid endarterectomy but he wanted to delay intervention and get back to the [facility]. [facility].  Surgery was scheduled for May of this year.  Ultimately he canceled the operation as he stated he did not want to live any longer.  We ordered a welfare check.  He comes in today with his niece who is now living with.  States he has now decided to proceed with surgery.  Has had no neurologic events since last evaluation or this year.  No neck surgery or neck radiation.  Remains on aspirin statin.    Past Medical History:  Diagnosis Date  . Diabetes mellitus without complication ([place])   . Hyperlipemia   . Hypertension     Past Surgical History:  Procedure Laterality Date  . APPENDECTOMY      Family History  Family history unknown: Yes    SOCIAL HISTORY: Social History   Tobacco Use  . Smoking status: Never Smoker  . Smokeless tobacco: Never Used  Substance Use Topics  . Alcohol use: No    No Known Allergies  Current Outpatient  Medications  Medication Sig Dispense Refill  . acetaminophen (TYLENOL) 650 MG CR tablet Take 1,300 mg by mouth 2 (two) times daily as needed for pain.    Marland Kitchen aspirin EC 81 MG tablet Take 81 mg by mouth daily.    Marland Kitchen atenolol (TENORMIN) 25 MG tablet Take 1 tablet (25 mg total) by mouth 2 (two) times daily. (Patient not taking: Reported on 05/20/2019)    . atorvastatin (LIPITOR) 40 MG tablet Take 40 mg by mouth at bedtime.    . calcium carbonate (OS-CAL - DOSED IN MG OF ELEMENTAL CALCIUM) 1250 (500 Ca) MG tablet Take 500 mg by mouth daily.    . Cholecalciferol (VITAMIN D-3) 25 MCG (1000 UT) CAPS Take 1,000 Units by mouth daily with breakfast.    . CINNAMON PO Take 1,000 mg by mouth daily.     . diphenhydrAMINE HCl (ZZZQUIL) 50 MG/30ML LIQD Take 50 mg by mouth at bedtime as needed (sleep).    Marland Kitchen FLUoxetine (PROZAC) 10 MG tablet Take 30 mg by mouth daily.    Marland Kitchen glucose blood (PRECISION XTRA TEST STRIPS) test strip 1 each by Other route as needed (blood glucose level). Use as instructed    . insulin aspart (NOVOLOG) 100 UNIT/ML injection Inject 20-30 Units into the skin 2 (two) times daily with a meal.     . insulin glargine (LANTUS) 100 UNIT/ML injection Inject 50 Units into the skin every evening.     . Insulin  Syringe-Needle U-100 (INSULIN SYRINGE .5CC/30GX1/2") 30G X 1/2" 0.5 ML MISC Inject 1 Syringe into the skin 4 (four) times daily.    Marland Kitchen levETIRAcetam (KEPPRA) 500 MG tablet Take 1 tablet (500 mg total) by mouth 2 (two) times daily. 60 tablet 4  . levothyroxine (SYNTHROID) 25 MCG tablet Take 25 mcg by mouth daily before breakfast.    . lisinopril (ZESTRIL) 20 MG tablet Take 1 tablet (20 mg total) by mouth daily.    . Multiple Vitamins-Minerals (ONE-A-DAY MENS 50+ ADVANTAGE) TABS Take 1 tablet by mouth daily with breakfast.    . ondansetron (ZOFRAN ODT) 4 MG disintegrating tablet Take 1 tablet (4 mg total) by mouth every 8 (eight) hours as needed for nausea or vomiting. 20 tablet 0   No current  facility-administered medications for this visit.    REVIEW OF SYSTEMS:  [X]  denotes positive finding, [ ]  denotes negative finding Cardiac  Comments:  Chest pain or chest pressure:    Shortness of breath upon exertion:    Short of breath when lying flat:    Irregular heart rhythm:        Vascular    Pain in calf, thigh, or hip brought on by ambulation:    Pain in feet at night that wakes you up from your sleep:     Blood clot in your veins:    Leg swelling:         Pulmonary    Oxygen at home:    Productive cough:     Wheezing:         Neurologic    Sudden weakness in arms or legs:     Sudden numbness in arms or legs:     Sudden onset of difficulty speaking or slurred speech:    Temporary loss of vision in one eye:     Problems with dizziness:         Gastrointestinal    Blood in stool:     Vomited blood:         Genitourinary    Burning when urinating:     Blood in urine:        Psychiatric    Major depression:         Hematologic    Bleeding problems:    Problems with blood clotting too easily:        Skin    Rashes or ulcers:        Constitutional    Fever or chills:      PHYSICAL EXAM: Vitals:   09/23/19 0818  Resp: 18  Weight: 200 lb (90.7 kg)  Height: 5\' 7"  (1.702 m)    GENERAL: The patient is a well-nourished male, in no acute distress. The vital signs are documented above. CARDIAC: There is a regular rate and rhythm.  PULMONARY: There is good air exchange bilaterally without wheezing or rales. ABDOMEN: Soft and non-tender with normal pitched bowel sounds.  MUSCULOSKELETAL: There are no major deformities or cyanosis. NEUROLOGIC: No focal weakness or paresthesias are detected.  CN II-XII grossly intact. SKIN: There are no ulcers or rashes noted. PSYCHIATRIC: The patient has a normal affect.  DATA:   I independently reviewed his CTA neck from last year again this shows a high-grade focal right ICA stenosis greater than  80%.  Assessment/Plan:  69 year old male presents to discuss high-grade asymptomatic right carotid stenosis.  I previously offered him a right carotid endarterectomy last year and he [facility]  He came back as a referral earlier this year from the [facility] and was scheduled for surgery back in May.  Ultimately he canceled surgery and stated he no longer wanted to live.  Before rescheduling his surgery again discussed I wanted to see him in the office. On evaluation today remains asymptomatic from a carotid standpoint.  He is here with his niece and seems to be in a better place.  He now wants to proceed with surgery.  We talked about the benefit of long-term stroke risk reduction.  Risk and benefits were discussed including risk of bleeding, infection, cranial nerve injury, perioperative stroke 1% risk.   I re-reviewed his CTA from last year when we were initially consulted and he clearly has a high-grade stenosis.  All of his imaging at the [facility] have also correlated with right ICA high-grade stenosis.  I have offered right carotid endarterectomy.  We will schedule surgery today.  I do want to get a updated carotid duplex to ensure this carotid remains patent prior to surgery given his imaging is now 71 months old.   Marty Heck, MD [facility] Office: [phone]

## 2019-09-24 ENCOUNTER — Other Ambulatory Visit: Payer: Self-pay | Admitting: *Deleted

## 2019-09-24 DIAGNOSIS — I6521 Occlusion and stenosis of right carotid artery: Secondary | ICD-10-CM

## 2019-09-25 NOTE — Progress Notes (Addendum)
[place], [place]. [place] [address] Phone: [phone] Fax: [phone]  [facility] [place], [address] AT [address] [place] [address] Phone: [phone] Fax: [phone]      Your procedure is scheduled on Wednesday, August 25th.  Report to [facility] Main Entrance "A" at 7:30 A.M., and check in at the Admitting office.  Call this number if you have problems the morning of surgery:  [phone]  Call [phone] if you have any questions prior to your surgery date Monday-Friday 8am-4pm    Remember:  Do not eat or drink after midnight the night before your surgery     Take these medicines the morning of surgery with A SIP OF WATER   Tylenol - if needed  Atorvastatin (Lipitor)  Fluoxetine (Prozac)  Keppra  Levothyroxine (Synthroid)   Follow your surgeon's instructions on when to stop Aspirin.  If no instructions were given by your surgeon then you will need to call the office to get those instructions.    As of today, STOP taking Aleve, Naproxen, Ibuprofen, Motrin, Advil, Goody's, BC's, all herbal medications, fish oil, and all vitamins.   WHAT DO I DO ABOUT MY DIABETES MEDICATION?   Marland Kitchen Do not take oral diabetes medicines (pills) the morning of surgery.  . THE NIGHT BEFORE SURGERY, do NOT take bedtime dose of Novolog   . THE NIGHT BEFORE SURGERY, take 40 units of Lantus at bedtime     . THE MORNING OF SURGERY, do NOT take Lantus or Novolog    HOW TO MANAGE YOUR DIABETES BEFORE AND AFTER SURGERY  Why is it important to control my blood sugar before and after surgery? . Improving blood sugar levels before and after surgery helps healing and can limit problems. . A way of improving blood sugar control is eating a healthy diet by: o  Eating less sugar and carbohydrates o  Increasing activity/exercise o  Talking with your  doctor about reaching your blood sugar goals . High blood sugars (greater than 180 mg/dL) can raise your risk of infections and slow your recovery, so you will need to focus on controlling your diabetes during the weeks before surgery. . Make sure that the doctor who takes care of your diabetes knows about your planned surgery including the date and location.  How do I manage my blood sugar before surgery? . Check your blood sugar at least 4 times a day, starting 2 days before surgery, to make sure that the level is not too high or low. . Check your blood sugar the morning of your surgery when you wake up and every 2 hours until you get to the Short Stay unit. o If your blood sugar is less than 70 mg/dL, you will need to treat for low blood sugar: - Do not take insulin. - Treat a low blood sugar (less than 70 mg/dL) with  cup of clear juice (cranberry or apple), 4 glucose tablets, OR glucose gel. - Recheck blood sugar in 15 minutes after treatment (to make sure it is greater than 70 mg/dL). If your blood sugar is not greater than 70 mg/dL on recheck, call [phone] for further instructions. . Report your blood sugar to the short stay nurse when you get to Short Stay.  . If you are admitted to the hospital after surgery: o Your blood sugar will be checked by the  staff and you will probably be given insulin after surgery (instead of oral diabetes medicines) to make sure you have good blood sugar levels. o The goal for blood sugar control after surgery is 80-180 mg/dL.               THE MORNING OF SURGERY:                   Do not wear jewelry            Do not wear lotions, powders, colognes, or deodorant.            Men may shave face and neck.            Do not bring valuables to the hospital.            [facility] is not responsible for any belongings or valuables.  Do NOT Smoke (Tobacco/Vaping) or drink Alcohol 24 hours prior to your procedure If you use a CPAP at night, you may  bring all equipment for your overnight stay.   Contacts, glasses, dentures or bridgework may not be worn into surgery.      For patients admitted to the hospital, discharge time will be determined by your treatment team.   Patients discharged the day of surgery will not be allowed to drive home, and someone needs to stay with them for 24 hours.    Special instructions:   [facility]- Preparing For Surgery  Before surgery, you can play an important role. Because skin is not sterile, your skin needs to be as free of germs as possible. You can reduce the number of germs on your skin by washing with CHG (chlorahexidine gluconate) Soap before surgery.  CHG is an antiseptic cleaner which kills germs and bonds with the skin to continue killing germs even after washing.    Oral Hygiene is also important to reduce your risk of infection.  Remember - BRUSH YOUR TEETH THE MORNING OF SURGERY WITH YOUR REGULAR TOOTHPASTE  Please do not use if you have an allergy to CHG or antibacterial soaps. If your skin becomes reddened/irritated stop using the CHG.  Do not shave (including legs and underarms) for at least 48 hours prior to first CHG shower. It is OK to shave your face.  Please follow these instructions carefully.   1. Shower the NIGHT BEFORE SURGERY and the MORNING OF SURGERY with CHG Soap.   2. If you chose to wash your hair, wash your hair first as usual with your normal shampoo.  3. After you shampoo, rinse your hair and body thoroughly to remove the shampoo.  4. Use CHG as you would any other liquid soap. You can apply CHG directly to the skin and wash gently with a scrungie or a clean washcloth.   5. Apply the CHG Soap to your body ONLY FROM THE NECK DOWN.  Do not use on open wounds or open sores. Avoid contact with your eyes, ears, mouth and genitals (private parts). Wash Face and genitals (private parts)  with your normal soap.   6. Wash thoroughly, paying special attention to the area  where your surgery will be performed.  7. Thoroughly rinse your body with warm water from the neck down.  8. DO NOT shower/wash with your normal soap after using and rinsing off the CHG Soap.  9. Pat yourself dry with a CLEAN TOWEL.  10. Wear CLEAN PAJAMAS to bed the night before surgery  11. Place CLEAN SHEETS  on your bed the night of your first shower and DO NOT SLEEP WITH PETS.   Day of Surgery: Wear Clean/Comfortable clothing the morning of surgery Do not apply any deodorants/lotions.   Remember to brush your teeth WITH YOUR REGULAR TOOTHPASTE.   Please read over the following fact sheets that you were given.

## 2019-09-26 ENCOUNTER — Encounter (HOSPITAL_COMMUNITY)
Admission: RE | Admit: 2019-09-26 | Discharge: 2019-09-26 | Disposition: A | Discharge status: Home / Self Care | Payer: No Typology Code available for payment source | Source: Ambulatory Visit | Attending: Vascular Surgery | Admitting: Vascular Surgery

## 2019-09-26 ENCOUNTER — Other Ambulatory Visit: Payer: Self-pay

## 2019-09-26 ENCOUNTER — Encounter: Payer: Self-pay | Admitting: *Deleted

## 2019-09-26 ENCOUNTER — Encounter (HOSPITAL_COMMUNITY): Payer: Self-pay

## 2019-09-26 DIAGNOSIS — Z01818 Encounter for other preprocedural examination: Secondary | ICD-10-CM | POA: Diagnosis present

## 2019-09-26 HISTORY — DX: Sleep apnea, unspecified: G47.30

## 2019-09-26 HISTORY — DX: Hypothyroidism, unspecified: E03.9

## 2019-09-26 HISTORY — DX: Anxiety disorder, unspecified: F41.9

## 2019-09-26 HISTORY — DX: Chronic kidney disease, unspecified: N18.9

## 2019-09-26 HISTORY — DX: Depression, unspecified: F32.A

## 2019-09-26 LAB — SURGICAL PCR SCREEN
MRSA, PCR: NEGATIVE
Staphylococcus aureus: POSITIVE — AB

## 2019-09-26 LAB — URINALYSIS, ROUTINE W REFLEX MICROSCOPIC
Bilirubin Urine: NEGATIVE
Glucose, UA: NEGATIVE mg/dL
Hgb urine dipstick: NEGATIVE
Ketones, ur: NEGATIVE mg/dL
Leukocytes,Ua: NEGATIVE
Nitrite: NEGATIVE
Protein, ur: NEGATIVE mg/dL
Specific Gravity, Urine: 1.016 (ref 1.005–1.030)
pH: 5 (ref 5.0–8.0)

## 2019-09-26 LAB — COMPREHENSIVE METABOLIC PANEL
ALT: 43 U/L (ref 0–44)
AST: 24 U/L (ref 15–41)
Albumin: 4.1 g/dL (ref 3.5–5.0)
Alkaline Phosphatase: 40 U/L (ref 38–126)
Anion gap: 11 (ref 5–15)
BUN: 37 mg/dL — ABNORMAL HIGH (ref 8–23)
CO2: 20 mmol/L — ABNORMAL LOW (ref 22–32)
Calcium: 9.4 mg/dL (ref 8.9–10.3)
Chloride: 111 mmol/L (ref 98–111)
Creatinine, Ser: 2.31 mg/dL — ABNORMAL HIGH (ref 0.61–1.24)
GFR calc Af Amer: 32 mL/min — ABNORMAL LOW (ref >60)
GFR calc non Af Amer: 28 mL/min — ABNORMAL LOW (ref >60)
Glucose, Bld: 177 mg/dL — ABNORMAL HIGH (ref 70–99)
Potassium: 4.4 mmol/L (ref 3.5–5.1)
Sodium: 142 mmol/L (ref 135–145)
Total Bilirubin: 0.6 mg/dL (ref 0.3–1.2)
Total Protein: 7.1 g/dL (ref 6.5–8.1)

## 2019-09-26 LAB — HEMOGLOBIN A1C
Hgb A1c MFr Bld: 7.2 % — ABNORMAL HIGH (ref 4.8–5.6)
Mean Plasma Glucose: 159.94 mg/dL

## 2019-09-26 LAB — CBC
HCT: 45.7 % (ref 39.0–52.0)
Hemoglobin: 14.7 g/dL (ref 13.0–17.0)
MCH: 30.6 pg (ref 26.0–34.0)
MCHC: 32.2 g/dL (ref 30.0–36.0)
MCV: 95 fL (ref 80.0–100.0)
Platelets: 216 10*3/uL (ref 150–400)
RBC: 4.81 MIL/uL (ref 4.22–5.81)
RDW: 13.2 % (ref 11.5–15.5)
WBC: 8.3 10*3/uL (ref 4.0–10.5)
nRBC: 0 % (ref 0.0–0.2)

## 2019-09-26 LAB — APTT: aPTT: 25 seconds (ref 24–36)

## 2019-09-26 LAB — PROTIME-INR
INR: 1 (ref 0.8–1.2)
Prothrombin Time: 12.7 seconds (ref 11.4–15.2)

## 2019-09-26 LAB — GLUCOSE, CAPILLARY: Glucose-Capillary: 153 mg/dL — ABNORMAL HIGH (ref 70–99)

## 2019-09-26 NOTE — Progress Notes (Signed)
PCP - [facility]  Chest x-ray - n/a EKG - 09-26-19  SA - yes, does not wear CPAP  DM - Type 1 Fasting Blood Sugar - 100s-200   Aspirin Instructions: Follow surgeon's instructions on when to stop ASA.  Patient stated understanding.  COVID TEST- 09-29-19   Anesthesia review: yes  Patient stated he had thoughts of harming himself.  Suicide questionnaire filled out.  Social work notified.  Spoke with Rosendo Gros, Therapist, sports.  Rosendo Gros came to PAT and spoke with patient. Per Rosendo Gros, patient seeks counseling and will reach out to counselor in regards to this matter.  No other orders given at this time. Lindsi Forte aware of situation.   Patient denies shortness of breath, fever, cough and chest pain at PAT appointment   All instructions explained to the patient, with a verbal understanding of the material. Patient agrees to go over the instructions while at home for a better understanding. Patient also instructed to self quarantine after being tested for COVID-19. The opportunity to ask questions was provided.

## 2019-09-26 NOTE — Progress Notes (Signed)
RNCM consulted regarding pt voicing that he had thoughts of harming himself.  RNCM met with pt in [place].  Pt states he has had these thought for many years and has actually been "institutionalized" several times.  Pt states he DOES NOT have a plan of carrying out self harm. Pt visits [facility] and has a counselor there to talk to.  RNCM advised pt to call counselor today, pt states he will.  RNCM will visit with pt on his day of surgery to follow-up.

## 2019-09-29 ENCOUNTER — Other Ambulatory Visit (HOSPITAL_COMMUNITY)
Admission: RE | Admit: 2019-09-29 | Discharge: 2019-09-29 | Disposition: A | Discharge status: Home / Self Care | Payer: No Typology Code available for payment source | Source: Ambulatory Visit | Attending: Vascular Surgery | Admitting: Vascular Surgery

## 2019-09-29 ENCOUNTER — Encounter (HOSPITAL_COMMUNITY): Payer: Self-pay

## 2019-09-29 DIAGNOSIS — Z01812 Encounter for preprocedural laboratory examination: Secondary | ICD-10-CM | POA: Insufficient documentation

## 2019-09-29 DIAGNOSIS — Z20822 Contact with and (suspected) exposure to covid-19: Secondary | ICD-10-CM | POA: Insufficient documentation

## 2019-09-29 LAB — SARS CORONAVIRUS 2 (TAT 6-24 HRS): SARS Coronavirus 2: NEGATIVE

## 2019-09-29 NOTE — Progress Notes (Signed)
Anesthesia Chart Review:  Case: [id number] Date/Time: 10/01/19 0915   Procedure: ENDARTERECTOMY CAROTID (Right )   Anesthesia type: General   Pre-op diagnosis: carotid stenosis   Location: [facility] OR ROOM 12 / [place] OR   Surgeons: Marty Heck, MD      DISCUSSION: Patient is a 69 year old male scheduled for the above procedure. Dr. 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 Seen by neurology, neurosurgery, and vascular surgery. Conservative management for SDH, was placed on Keppra for seizure prophylaxis until follow-up with neurosurgery. Right carotid endarterectomy was being considered at that time, but would have to delay at least 2 weeks per neurosurgery. Surgery was delayed initially because patient wanted to consider surgery at the [facility] and then later because patient did not wish to proceed citing no will to live. He apparently now has more support and has opted to proceed with right CEA.   History includes smoking, HTN, IDDM, HLD, CKD, hypothyroidism, anxiety, depression (with history of multiple hospitalizations for suicide ideation and/or suicide attempts), OSA (does not wear CPAP), SDH (fall with SDH with subsequent witnessed seizure, incidental finding of severe RICA stenosis 07/2018) BMI is consistent with obesity.   In review of labs, his Creatinine as been > 2.0 (2.06-2.70) since 08/2014. This is consistent with history of CKD, although currently no [facility] records available. I did fax a request for [facility] records, but often difficult to get records in a short amount of time. It is reassuring that 09/26/19 preoperative Creatinine was 2.31, which was actually down from 2.60 on 07/28/18 and is consistent with labs since 2016. A1c 7.2%.   Preoperative EKG showed NSR. Denied SOB and chest pain at PAT RN at PAT RN visit.  Of note he did report thoughts of harming  himself which have been present for many years.  He had an [facility] consult while at 09/26/2019 PAT visit.  He denied having a plan to carry out any self-harm.  He reported seeing a Social worker at the [facility]. RNCM plans to follow-up with patient during hospitalization.   He is for presurgical COVID-19 testing on 09/29/2019.  Anesthesia team to evaluate on the day of surgery.  VS: BP (!) 118/57   Pulse 96   Temp (!) 36.3 C (Temporal)   Resp 18   Ht 5\' 7"  (1.702 m)   Wt 91.5 kg   SpO2 99%   BMI 31.59 kg/m    PROVIDERS: [facility] is where he receives primary care [place], Mallie Mussel, MD is neurosurgery (for SDH)   LABS: Preoperative labs noted. Creatinine 2.31, consistent with labs results since at least 2016.  (all labs ordered are listed, but only abnormal results are displayed)  Labs Reviewed  SURGICAL PCR SCREEN - Abnormal; Notable for the following components:      Result Value   Staphylococcus aureus POSITIVE (*)    All other components within normal limits  GLUCOSE, CAPILLARY - Abnormal; Notable for the following components:   Glucose-Capillary 153 (*)    All other components within normal limits  COMPREHENSIVE METABOLIC PANEL - Abnormal; Notable for the following components:   CO2 20 (*)    Glucose, Bld 177 (*)    BUN 37 (*)    Creatinine, Ser 2.31 (*)    GFR calc non Af Amer 28 (*)    GFR calc Af Amer 32 (*)    All other components within normal  limits  HEMOGLOBIN A1C - Abnormal; Notable for the following components:   Hgb A1c MFr Bld 7.2 (*)    All other components within normal limits  APTT  CBC  PROTIME-INR  URINALYSIS, ROUTINE W REFLEX MICROSCOPIC     IMAGES: CT Head 09/23/18: IMPRESSION: 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 No new hemorrhage or midline shift.  CT Head/CTA head/neck 07/26/18: CTA IMPRESSION: 1. High-grade stenosis of the proximal RIGHT ICA, estimated  75-90%, potentially greater. Vascular surgical consultation is warranted, given the reduced caliber of the cervical ICA. 2. Non stenotic atheromatous change LEFT carotid bifurcation. 3. No intracranial large vessel occlusion. 4. See additional significant findings noncontrast CT scan related to subdural fluid collections and focal LEFT parietal subdural clot. 5.  Aortic Atherosclerosis (ICD10-I70.0). CT IMPRESSION: IMPRESSION: 1. BILATERAL low attenuation extra-axial fluid collections representing either subdural hygromas or chronic subdural hematomas. 2. ASPECTS is 10. 3. Acute thrombus, LEFT subdural space, 12 x 17 mm. Thrombolysis/tPA is contraindicated. 4. These results were called by telephone at the time of interpretation on 07/26/2018 at 5:07 pm to Dr. Rory Percy , who verbally acknowledged these results.  MRI Brain 07/26/18: IMPRESSION: 1. No acute intracranial infarct identified. 2. Bilateral subdural collections, chronic in appearance on the right, either representing a chronic subdural hematoma or hygroma. Collection on the left is acute on chronic in appearance with scattered internal acute blood products, stable from prior CT. 3. Small chronic left frontal lobe infarct. 4. Underlying moderately advanced cerebral atrophy with mild chronic microvascular ischemic disease.   EKG: 09/26/19: NSR   CV: Carotid US 09/23/19: Summary:  - Right Carotid: Velocities in the right ICA are consistent with a 60-79% stenosis.  - Left Carotid: Velocities in the left ICA are consistent with a 1-39% stenosis.  - Vertebrals: Bilateral vertebral arteries demonstrate antegrade flow.  - Subclavians: Normal flow hemodynamics were seen in bilateral subclavian arteries.    Past Medical History:  Diagnosis Date  . Anxiety   . Chronic kidney disease   . Depression   . Diabetes mellitus without complication (HCC)    Type 1  . Hyperlipemia   . Hypertension   . Hypothyroidism   . Sleep  apnea     Past Surgical History:  Procedure Laterality Date  . APPENDECTOMY    . EYE SURGERY      MEDICATIONS: . acetaminophen (TYLENOL) 650 MG CR tablet  . aspirin EC 81 MG tablet  . atenolol (TENORMIN) 25 MG tablet  . atorvastatin (LIPITOR) 40 MG tablet  . calcium carbonate (OSCAL) 1500 (600 Ca) MG TABS tablet  . Cholecalciferol (VITAMIN D-3) 25 MCG (1000 UT) CAPS  . CINNAMON PO  . diphenhydrAMINE HCl (ZZZQUIL) 50 MG/30ML LIQD  . FLUoxetine (PROZAC) 10 MG tablet  . glucose blood (PRECISION XTRA TEST STRIPS) test strip  . insulin aspart (NOVOLOG) 100 UNIT/ML injection  . insulin glargine (LANTUS) 100 UNIT/ML injection  . Insulin Syringe-Needle U-100 (INSULIN SYRINGE .5CC/30GX1/2") 30G X 1/2" 0.5 ML MISC  . levETIRAcetam (KEPPRA) 500 MG tablet  . levothyroxine (SYNTHROID) 25 MCG tablet  . lisinopril (ZESTRIL) 10 MG tablet  . lisinopril (ZESTRIL) 20 MG tablet  . Multiple Vitamins-Minerals (ONE-A-DAY MENS 50+ ADVANTAGE) TABS  . ondansetron (ZOFRAN ODT) 4 MG disintegrating tablet  . tamsulosin (FLOMAX) 0.4 MG CAPS capsule   No current facility-administered medications for this encounter.  He is not currently on atenolol per current medication list.   Myra Gianotti, PA-C Surgical  Short Stay/Anesthesiology [facility] Phone [phone] [facility] Phone [phone] 09/29/2019 12:56 PM

## 2019-10-01 ENCOUNTER — Encounter (HOSPITAL_COMMUNITY): Payer: Self-pay | Admitting: Vascular Surgery

## 2019-10-01 ENCOUNTER — Inpatient Hospital Stay (HOSPITAL_COMMUNITY): Payer: No Typology Code available for payment source | Admitting: Certified Registered Nurse Anesthetist

## 2019-10-01 ENCOUNTER — Other Ambulatory Visit: Payer: Self-pay

## 2019-10-01 ENCOUNTER — Inpatient Hospital Stay (HOSPITAL_COMMUNITY): Payer: No Typology Code available for payment source | Admitting: Vascular Surgery

## 2019-10-01 ENCOUNTER — Encounter (HOSPITAL_COMMUNITY)
Admission: RE | Disposition: A | Discharge status: Home / Self Care | Payer: Self-pay | Source: Home / Self Care | Attending: Vascular Surgery

## 2019-10-01 ENCOUNTER — Inpatient Hospital Stay (HOSPITAL_COMMUNITY)
Admission: RE | Admit: 2019-10-01 | Discharge: 2019-10-02 | DRG: 039 | Disposition: A | Discharge status: Home / Self Care | Payer: No Typology Code available for payment source | Attending: Vascular Surgery | Admitting: Vascular Surgery

## 2019-10-01 DIAGNOSIS — I6521 Occlusion and stenosis of right carotid artery: Secondary | ICD-10-CM | POA: Diagnosis present

## 2019-10-01 DIAGNOSIS — E039 Hypothyroidism, unspecified: Secondary | ICD-10-CM | POA: Diagnosis present

## 2019-10-01 DIAGNOSIS — I129 Hypertensive chronic kidney disease with stage 1 through stage 4 chronic kidney disease, or unspecified chronic kidney disease: Secondary | ICD-10-CM | POA: Diagnosis present

## 2019-10-01 DIAGNOSIS — Z7982 Long term (current) use of aspirin: Secondary | ICD-10-CM | POA: Diagnosis not present

## 2019-10-01 DIAGNOSIS — Z79899 Other long term (current) drug therapy: Secondary | ICD-10-CM | POA: Diagnosis not present

## 2019-10-01 DIAGNOSIS — N189 Chronic kidney disease, unspecified: Secondary | ICD-10-CM | POA: Diagnosis present

## 2019-10-01 DIAGNOSIS — Z20822 Contact with and (suspected) exposure to covid-19: Secondary | ICD-10-CM | POA: Diagnosis present

## 2019-10-01 DIAGNOSIS — Z794 Long term (current) use of insulin: Secondary | ICD-10-CM

## 2019-10-01 DIAGNOSIS — E785 Hyperlipidemia, unspecified: Secondary | ICD-10-CM | POA: Diagnosis present

## 2019-10-01 DIAGNOSIS — R6884 Jaw pain: Secondary | ICD-10-CM | POA: Diagnosis not present

## 2019-10-01 DIAGNOSIS — Z79890 Hormone replacement therapy (postmenopausal): Secondary | ICD-10-CM | POA: Diagnosis not present

## 2019-10-01 DIAGNOSIS — Z8673 Personal history of transient ischemic attack (TIA), and cerebral infarction without residual deficits: Secondary | ICD-10-CM | POA: Diagnosis not present

## 2019-10-01 DIAGNOSIS — E1122 Type 2 diabetes mellitus with diabetic chronic kidney disease: Secondary | ICD-10-CM | POA: Diagnosis present

## 2019-10-01 HISTORY — PX: ENDARTERECTOMY: SHX5162

## 2019-10-01 HISTORY — PX: PATCH ANGIOPLASTY: SHX6230

## 2019-10-01 LAB — GLUCOSE, CAPILLARY
Glucose-Capillary: 151 mg/dL — ABNORMAL HIGH (ref 70–99)
Glucose-Capillary: 194 mg/dL — ABNORMAL HIGH (ref 70–99)
Glucose-Capillary: 256 mg/dL — ABNORMAL HIGH (ref 70–99)
Glucose-Capillary: 247 mg/dL — ABNORMAL HIGH (ref 70–99)

## 2019-10-01 LAB — POCT ACTIVATED CLOTTING TIME: Activated Clotting Time: 241 seconds

## 2019-10-01 LAB — TYPE AND SCREEN
ABO/RH(D): A NEG
Antibody Screen: NEGATIVE

## 2019-10-01 LAB — ABO/RH: ABO/RH(D): A NEG

## 2019-10-01 SURGERY — ENDARTERECTOMY, CAROTID
Anesthesia: General | Site: Neck | Laterality: Right

## 2019-10-01 MED ORDER — INSULIN ASPART 100 UNIT/ML ~~LOC~~ SOLN
0.0000 [IU] | Freq: Three times a day (TID) | SUBCUTANEOUS | Status: DC
  Administered 2019-10-01: 8 [IU] via SUBCUTANEOUS
  Administered 2019-10-02: 3 [IU] via SUBCUTANEOUS

## 2019-10-01 MED ORDER — SODIUM CHLORIDE 0.9 % IV SOLN: INTRAVENOUS | Status: DC

## 2019-10-01 MED ORDER — OXYCODONE HCL 5 MG/5ML PO SOLN: 5.0000 mg | Freq: Once | ORAL | Status: DC | PRN

## 2019-10-01 MED ORDER — TAMSULOSIN HCL 0.4 MG PO CAPS
0.4000 mg | ORAL_CAPSULE | Freq: Every day | ORAL | Status: DC
  Administered 2019-10-02: 0.4 mg via ORAL
  Filled 2019-10-01: qty 1

## 2019-10-01 MED ORDER — ROCURONIUM BROMIDE 10 MG/ML (PF) SYRINGE
PREFILLED_SYRINGE | INTRAVENOUS | Status: DC | PRN
  Administered 2019-10-01: 20 mg via INTRAVENOUS
  Administered 2019-10-01: 60 mg via INTRAVENOUS
  Administered 2019-10-01: 20 mg via INTRAVENOUS

## 2019-10-01 MED ORDER — FENTANYL CITRATE (PF) 100 MCG/2ML IJ SOLN: 25.0000 ug | INTRAMUSCULAR | Status: DC | PRN

## 2019-10-01 MED ORDER — PROPOFOL 10 MG/ML IV BOLUS
INTRAVENOUS | Status: AC
  Filled 2019-10-01: qty 20

## 2019-10-01 MED ORDER — SUGAMMADEX SODIUM 200 MG/2ML IV SOLN
INTRAVENOUS | Status: DC | PRN
  Administered 2019-10-01 (×2): 200 mg via INTRAVENOUS

## 2019-10-01 MED ORDER — NITROGLYCERIN IN D5W 200-5 MCG/ML-% IV SOLN
INTRAVENOUS | Status: DC | PRN
  Administered 2019-10-01: 20 ug/min via INTRAVENOUS

## 2019-10-01 MED ORDER — FLUOXETINE HCL 10 MG PO CAPS
30.0000 mg | ORAL_CAPSULE | Freq: Every day | ORAL | Status: DC
  Administered 2019-10-02: 30 mg via ORAL
  Filled 2019-10-01: qty 3

## 2019-10-01 MED ORDER — CEFAZOLIN SODIUM-DEXTROSE 2-4 GM/100ML-% IV SOLN
2.0000 g | INTRAVENOUS | Status: AC
  Administered 2019-10-01: 2 g via INTRAVENOUS
  Filled 2019-10-01: qty 100

## 2019-10-01 MED ORDER — 0.9 % SODIUM CHLORIDE (POUR BTL) OPTIME
TOPICAL | Status: DC | PRN
  Administered 2019-10-01: 3000 mL

## 2019-10-01 MED ORDER — POLYETHYLENE GLYCOL 3350 17 G PO PACK: 17.0000 g | PACK | Freq: Every day | ORAL | Status: DC | PRN

## 2019-10-01 MED ORDER — OXYCODONE HCL 5 MG PO TABS: 5.0000 mg | ORAL_TABLET | Freq: Once | ORAL | Status: DC | PRN

## 2019-10-01 MED ORDER — CHLORHEXIDINE GLUCONATE 0.12 % MT SOLN
15.0000 mL | Freq: Once | OROMUCOSAL | Status: AC
  Administered 2019-10-01: 15 mL via OROMUCOSAL
  Filled 2019-10-01: qty 15

## 2019-10-01 MED ORDER — DOCUSATE SODIUM 100 MG PO CAPS: 100.0000 mg | ORAL_CAPSULE | Freq: Every day | ORAL | Status: DC

## 2019-10-01 MED ORDER — CHLORHEXIDINE GLUCONATE CLOTH 2 % EX PADS: 6.0000 | MEDICATED_PAD | Freq: Once | CUTANEOUS | Status: DC

## 2019-10-01 MED ORDER — PROTAMINE SULFATE 10 MG/ML IV SOLN
INTRAVENOUS | Status: DC | PRN
  Administered 2019-10-01: 50 mg via INTRAVENOUS

## 2019-10-01 MED ORDER — GUAIFENESIN-DM 100-10 MG/5ML PO SYRP: 15.0000 mL | ORAL_SOLUTION | ORAL | Status: DC | PRN

## 2019-10-01 MED ORDER — LABETALOL HCL 5 MG/ML IV SOLN
INTRAVENOUS | Status: DC | PRN
  Administered 2019-10-01: 5 mg via INTRAVENOUS

## 2019-10-01 MED ORDER — LIDOCAINE 2% (20 MG/ML) 5 ML SYRINGE
INTRAMUSCULAR | Status: AC
  Filled 2019-10-01: qty 5

## 2019-10-01 MED ORDER — ONDANSETRON HCL 4 MG/2ML IJ SOLN: 4.0000 mg | Freq: Four times a day (QID) | INTRAMUSCULAR | Status: DC | PRN

## 2019-10-01 MED ORDER — LEVETIRACETAM 500 MG PO TABS
500.0000 mg | ORAL_TABLET | Freq: Two times a day (BID) | ORAL | Status: DC
  Administered 2019-10-01 – 2019-10-02 (×2): 500 mg via ORAL
  Filled 2019-10-01 (×2): qty 1

## 2019-10-01 MED ORDER — FENTANYL CITRATE (PF) 250 MCG/5ML IJ SOLN
INTRAMUSCULAR | Status: DC | PRN
Start: 2019-10-01 — End: 2019-10-01
  Administered 2019-10-01 (×2): 25 ug via INTRAVENOUS
  Administered 2019-10-01: 100 ug via INTRAVENOUS
  Administered 2019-10-01 (×2): 50 ug via INTRAVENOUS

## 2019-10-01 MED ORDER — MIDAZOLAM HCL 2 MG/2ML IJ SOLN: 0.5000 mg | Freq: Once | INTRAMUSCULAR | Status: DC | PRN

## 2019-10-01 MED ORDER — DEXAMETHASONE SODIUM PHOSPHATE 10 MG/ML IJ SOLN
INTRAMUSCULAR | Status: AC
  Filled 2019-10-01: qty 1

## 2019-10-01 MED ORDER — PHENOL 1.4 % MT LIQD: 1.0000 | OROMUCOSAL | Status: DC | PRN

## 2019-10-01 MED ORDER — VITAMIN D 25 MCG (1000 UNIT) PO TABS
1000.0000 [IU] | ORAL_TABLET | Freq: Every day | ORAL | Status: DC
  Administered 2019-10-02: 1000 [IU] via ORAL
  Filled 2019-10-01: qty 1

## 2019-10-01 MED ORDER — HEMOSTATIC AGENTS (NO CHARGE) OPTIME
TOPICAL | Status: DC | PRN
  Administered 2019-10-01: 1 via TOPICAL

## 2019-10-01 MED ORDER — ESMOLOL HCL 100 MG/10ML IV SOLN
INTRAVENOUS | Status: AC
  Filled 2019-10-01: qty 10

## 2019-10-01 MED ORDER — SODIUM CHLORIDE 0.9 % IV SOLN
INTRAVENOUS | Status: AC
  Filled 2019-10-01: qty 1.2

## 2019-10-01 MED ORDER — ACETAMINOPHEN 650 MG RE SUPP: 325.0000 mg | RECTAL | Status: DC | PRN

## 2019-10-01 MED ORDER — ROCURONIUM BROMIDE 10 MG/ML (PF) SYRINGE
PREFILLED_SYRINGE | INTRAVENOUS | Status: AC
  Filled 2019-10-01: qty 10

## 2019-10-01 MED ORDER — MEPERIDINE HCL 25 MG/ML IJ SOLN: 6.2500 mg | INTRAMUSCULAR | Status: DC | PRN

## 2019-10-01 MED ORDER — LACTATED RINGERS IV SOLN: INTRAVENOUS | Status: DC

## 2019-10-01 MED ORDER — OXYCODONE-ACETAMINOPHEN 5-325 MG PO TABS: 1.0000 | ORAL_TABLET | ORAL | Status: DC | PRN

## 2019-10-01 MED ORDER — ACETAMINOPHEN 325 MG PO TABS
325.0000 mg | ORAL_TABLET | ORAL | Status: DC | PRN
  Administered 2019-10-02: 650 mg via ORAL
  Filled 2019-10-01: qty 2

## 2019-10-01 MED ORDER — METOPROLOL TARTRATE 5 MG/5ML IV SOLN: 2.0000 mg | INTRAVENOUS | Status: DC | PRN

## 2019-10-01 MED ORDER — PHENYLEPHRINE 40 MCG/ML (10ML) SYRINGE FOR IV PUSH (FOR BLOOD PRESSURE SUPPORT)
PREFILLED_SYRINGE | INTRAVENOUS | Status: DC | PRN
  Administered 2019-10-01: 80 ug via INTRAVENOUS

## 2019-10-01 MED ORDER — BISACODYL 10 MG RE SUPP: 10.0000 mg | Freq: Every day | RECTAL | Status: DC | PRN

## 2019-10-01 MED ORDER — CALCIUM CARBONATE 1250 (500 CA) MG PO TABS
1250.0000 mg | ORAL_TABLET | Freq: Every day | ORAL | Status: DC
  Filled 2019-10-01: qty 1

## 2019-10-01 MED ORDER — LABETALOL HCL 5 MG/ML IV SOLN
INTRAVENOUS | Status: AC
  Filled 2019-10-01: qty 4

## 2019-10-01 MED ORDER — PROMETHAZINE HCL 25 MG/ML IJ SOLN: 6.2500 mg | INTRAMUSCULAR | Status: DC | PRN

## 2019-10-01 MED ORDER — HYDROMORPHONE HCL 1 MG/ML IJ SOLN: 0.5000 mg | INTRAMUSCULAR | Status: DC | PRN

## 2019-10-01 MED ORDER — PROPOFOL 10 MG/ML IV BOLUS
INTRAVENOUS | Status: DC | PRN
  Administered 2019-10-01: 100 mg via INTRAVENOUS
  Administered 2019-10-01 (×2): 50 mg via INTRAVENOUS

## 2019-10-01 MED ORDER — LEVOTHYROXINE SODIUM 25 MCG PO TABS
25.0000 ug | ORAL_TABLET | Freq: Every day | ORAL | Status: DC
  Administered 2019-10-02: 25 ug via ORAL
  Filled 2019-10-01: qty 1

## 2019-10-01 MED ORDER — SODIUM CHLORIDE 0.9 % IV SOLN: 500.0000 mL | Freq: Once | INTRAVENOUS | Status: DC | PRN

## 2019-10-01 MED ORDER — MAGNESIUM SULFATE 2 GM/50ML IV SOLN: 2.0000 g | Freq: Every day | INTRAVENOUS | Status: DC | PRN

## 2019-10-01 MED ORDER — GLYCOPYRROLATE PF 0.2 MG/ML IJ SOSY
PREFILLED_SYRINGE | INTRAMUSCULAR | Status: AC
  Filled 2019-10-01: qty 1

## 2019-10-01 MED ORDER — ONDANSETRON HCL 4 MG/2ML IJ SOLN
INTRAMUSCULAR | Status: DC | PRN
  Administered 2019-10-01: 4 mg via INTRAVENOUS

## 2019-10-01 MED ORDER — LISINOPRIL 10 MG PO TABS
30.0000 mg | ORAL_TABLET | Freq: Every day | ORAL | Status: DC
  Administered 2019-10-01: 30 mg via ORAL
  Filled 2019-10-01 (×2): qty 3

## 2019-10-01 MED ORDER — ACETAMINOPHEN 500 MG PO TABS: 1000.0000 mg | ORAL_TABLET | Freq: Once | ORAL | Status: DC

## 2019-10-01 MED ORDER — PANTOPRAZOLE SODIUM 40 MG PO TBEC
40.0000 mg | DELAYED_RELEASE_TABLET | Freq: Every day | ORAL | Status: DC
  Administered 2019-10-01 – 2019-10-02 (×2): 40 mg via ORAL
  Filled 2019-10-01 (×2): qty 1

## 2019-10-01 MED ORDER — ASPIRIN EC 81 MG PO TBEC
81.0000 mg | DELAYED_RELEASE_TABLET | Freq: Every day | ORAL | Status: DC
  Administered 2019-10-02: 81 mg via ORAL
  Filled 2019-10-01: qty 1

## 2019-10-01 MED ORDER — CEFAZOLIN SODIUM-DEXTROSE 2-4 GM/100ML-% IV SOLN
2.0000 g | Freq: Three times a day (TID) | INTRAVENOUS | Status: AC
  Administered 2019-10-01 – 2019-10-02 (×2): 2 g via INTRAVENOUS
  Filled 2019-10-01 (×2): qty 100

## 2019-10-01 MED ORDER — PHENYLEPHRINE HCL-NACL 10-0.9 MG/250ML-% IV SOLN
INTRAVENOUS | Status: DC | PRN
  Administered 2019-10-01: 30 ug/min via INTRAVENOUS

## 2019-10-01 MED ORDER — POTASSIUM CHLORIDE CRYS ER 20 MEQ PO TBCR: 20.0000 meq | EXTENDED_RELEASE_TABLET | Freq: Every day | ORAL | Status: DC | PRN

## 2019-10-01 MED ORDER — DEXAMETHASONE SODIUM PHOSPHATE 10 MG/ML IJ SOLN
INTRAMUSCULAR | Status: DC | PRN
  Administered 2019-10-01: 4 mg via INTRAVENOUS

## 2019-10-01 MED ORDER — LIDOCAINE 2% (20 MG/ML) 5 ML SYRINGE
INTRAMUSCULAR | Status: DC | PRN
  Administered 2019-10-01: 30 mg via INTRAVENOUS
  Administered 2019-10-01: 70 mg via INTRAVENOUS

## 2019-10-01 MED ORDER — ESMOLOL HCL 100 MG/10ML IV SOLN
INTRAVENOUS | Status: DC | PRN
  Administered 2019-10-01: 30 mg via INTRAVENOUS
  Administered 2019-10-01 (×2): 20 mg via INTRAVENOUS
  Administered 2019-10-01: 30 mg via INTRAVENOUS

## 2019-10-01 MED ORDER — FENTANYL CITRATE (PF) 250 MCG/5ML IJ SOLN
INTRAMUSCULAR | Status: AC
  Filled 2019-10-01: qty 5

## 2019-10-01 MED ORDER — ATORVASTATIN CALCIUM 40 MG PO TABS
40.0000 mg | ORAL_TABLET | Freq: Every day | ORAL | Status: DC
  Administered 2019-10-01: 40 mg via ORAL
  Filled 2019-10-01: qty 1

## 2019-10-01 MED ORDER — GLYCOPYRROLATE PF 0.2 MG/ML IJ SOSY
PREFILLED_SYRINGE | INTRAMUSCULAR | Status: DC | PRN
  Administered 2019-10-01: .2 mg via INTRAVENOUS

## 2019-10-01 MED ORDER — HEPARIN SODIUM (PORCINE) 1000 UNIT/ML IJ SOLN
INTRAMUSCULAR | Status: DC | PRN
  Administered 2019-10-01: 2000 [IU] via INTRAVENOUS
  Administered 2019-10-01: 9000 [IU] via INTRAVENOUS

## 2019-10-01 MED ORDER — LIDOCAINE HCL (PF) 1 % IJ SOLN
INTRAMUSCULAR | Status: AC
  Filled 2019-10-01: qty 30

## 2019-10-01 MED ORDER — LABETALOL HCL 5 MG/ML IV SOLN: 10.0000 mg | INTRAVENOUS | Status: DC | PRN

## 2019-10-01 MED ORDER — ORAL CARE MOUTH RINSE: 15.0000 mL | Freq: Once | OROMUCOSAL | Status: AC

## 2019-10-01 MED ORDER — HYDRALAZINE HCL 20 MG/ML IJ SOLN: 5.0000 mg | INTRAMUSCULAR | Status: DC | PRN

## 2019-10-01 MED ORDER — INSULIN GLARGINE 100 UNIT/ML ~~LOC~~ SOLN
50.0000 [IU] | Freq: Every day | SUBCUTANEOUS | Status: DC
  Administered 2019-10-01: 50 [IU] via SUBCUTANEOUS
  Filled 2019-10-01 (×2): qty 0.5

## 2019-10-01 MED ORDER — SODIUM CHLORIDE 0.9 % IV SOLN
INTRAVENOUS | Status: DC | PRN
  Administered 2019-10-01: 500 mL

## 2019-10-01 MED ORDER — ADULT MULTIVITAMIN W/MINERALS CH
1.0000 | ORAL_TABLET | Freq: Every day | ORAL | Status: DC
  Administered 2019-10-02: 1 via ORAL
  Filled 2019-10-01: qty 1

## 2019-10-01 MED ORDER — ONDANSETRON HCL 4 MG/2ML IJ SOLN
INTRAMUSCULAR | Status: AC
  Filled 2019-10-01: qty 2

## 2019-10-01 SURGICAL SUPPLY — 63 items
ADH SKN CLS APL DERMABOND .7 (GAUZE/BANDAGES/DRESSINGS) ×1
ADPR TBG 2 MALE LL ART (MISCELLANEOUS)
CANISTER SUCT 3000ML PPV (MISCELLANEOUS) ×3 IMPLANT
CATH ROBINSON RED A/P 18FR (CATHETERS) ×3 IMPLANT
CLIP VESOCCLUDE MED 24/CT (CLIP) ×3 IMPLANT
CLIP VESOCCLUDE SM WIDE 24/CT (CLIP) ×3 IMPLANT
COVER PROBE W GEL 5X96 (DRAPES) ×2 IMPLANT
COVER TRANSDUCER ULTRASND GEL (DISPOSABLE) ×1 IMPLANT
COVER WAND RF STERILE (DRAPES) ×1 IMPLANT
DERMABOND ADVANCED (GAUZE/BANDAGES/DRESSINGS) ×2
DERMABOND ADVANCED .7 DNX12 (GAUZE/BANDAGES/DRESSINGS) ×1 IMPLANT
DRAIN CHANNEL 15F RND FF W/TCR (WOUND CARE) IMPLANT
ELECT REM PT RETURN 9FT ADLT (ELECTROSURGICAL) ×3
ELECTRODE REM PT RTRN 9FT ADLT (ELECTROSURGICAL) ×1 IMPLANT
EVACUATOR SILICONE 100CC (DRAIN) IMPLANT
GLOVE BIO SURGEON STRL SZ 6.5 (GLOVE) ×2 IMPLANT
GLOVE BIO SURGEON STRL SZ7.5 (GLOVE) ×1 IMPLANT
GLOVE BIO SURGEONS STRL SZ 6.5 (GLOVE) ×2
GLOVE BIOGEL PI IND STRL 6.5 (GLOVE) IMPLANT
GLOVE BIOGEL PI IND STRL 7.5 (GLOVE) IMPLANT
GLOVE BIOGEL PI IND STRL 8 (GLOVE) ×1 IMPLANT
GLOVE BIOGEL PI INDICATOR 6.5 (GLOVE) ×4
GLOVE BIOGEL PI INDICATOR 7.5 (GLOVE) ×4
GLOVE BIOGEL PI INDICATOR 8 (GLOVE)
GLOVE ECLIPSE 6.5 STRL STRAW (GLOVE) ×2 IMPLANT
GLOVE SURG SS PI 7.0 STRL IVOR (GLOVE) ×4 IMPLANT
GOWN STRL REUS W/ TWL LRG LVL3 (GOWN DISPOSABLE) ×2 IMPLANT
GOWN STRL REUS W/ TWL XL LVL3 (GOWN DISPOSABLE) ×2 IMPLANT
GOWN STRL REUS W/TWL LRG LVL3 (GOWN DISPOSABLE) ×9
GOWN STRL REUS W/TWL XL LVL3 (GOWN DISPOSABLE) ×3
HEMOSTAT SNOW SURGICEL 2X4 (HEMOSTASIS) ×2 IMPLANT
IV ADAPTER SYR DOUBLE MALE LL (MISCELLANEOUS) IMPLANT
KIT BASIN OR (CUSTOM PROCEDURE TRAY) ×3 IMPLANT
KIT SHUNT ARGYLE CAROTID ART 6 (VASCULAR PRODUCTS) IMPLANT
KIT TURNOVER KIT B (KITS) ×3 IMPLANT
LOOP VESSEL MINI RED (MISCELLANEOUS) IMPLANT
NDL HYPO 25GX1X1/2 BEV (NEEDLE) IMPLANT
NDL SPNL 20GX3.5 QUINCKE YW (NEEDLE) IMPLANT
NEEDLE HYPO 25GX1X1/2 BEV (NEEDLE) IMPLANT
NEEDLE SPNL 20GX3.5 QUINCKE YW (NEEDLE) IMPLANT
NS IRRIG 1000ML POUR BTL (IV SOLUTION) ×9 IMPLANT
PACK CAROTID (CUSTOM PROCEDURE TRAY) ×3 IMPLANT
PAD ARMBOARD 7.5X6 YLW CONV (MISCELLANEOUS) ×6 IMPLANT
PATCH VASC XENOSURE 1CMX6CM (Vascular Products) ×3 IMPLANT
PATCH VASC XENOSURE 1X6 (Vascular Products) IMPLANT
POSITIONER HEAD DONUT 9IN (MISCELLANEOUS) ×3 IMPLANT
SHUNT CAROTID BYPASS 10 (VASCULAR PRODUCTS) ×2 IMPLANT
SHUNT CAROTID BYPASS 12FRX15.5 (VASCULAR PRODUCTS) IMPLANT
SPONGE SURGIFOAM ABS GEL 100 (HEMOSTASIS) IMPLANT
STOPCOCK 4 WAY LG BORE MALE ST (IV SETS) IMPLANT
SUT ETHILON 3 0 PS 1 (SUTURE) IMPLANT
SUT MNCRL AB 4-0 PS2 18 (SUTURE) ×3 IMPLANT
SUT PROLENE 5 0 C 1 24 (SUTURE) ×3 IMPLANT
SUT PROLENE 6 0 BV (SUTURE) ×3 IMPLANT
SUT PROLENE 7 0 BV1 MDA (SUTURE) ×2 IMPLANT
SUT SILK 3 0 (SUTURE)
SUT SILK 3-0 18XBRD TIE 12 (SUTURE) IMPLANT
SUT VIC AB 3-0 SH 27 (SUTURE) ×3
SUT VIC AB 3-0 SH 27X BRD (SUTURE) ×1 IMPLANT
SYR CONTROL 10ML LL (SYRINGE) IMPLANT
TOWEL GREEN STERILE (TOWEL DISPOSABLE) ×3 IMPLANT
TUBING ART PRESS 48 MALE/FEM (TUBING) IMPLANT
WATER STERILE IRR 1000ML POUR (IV SOLUTION) ×3 IMPLANT

## 2019-10-01 NOTE — Anesthesia Preprocedure Evaluation (Addendum)
Anesthesia Evaluation  Patient identified by MRN, date of birth, ID band Patient awake    Reviewed: Allergy & Precautions, NPO status , Patient's Chart, lab work & pertinent test results, reviewed documented beta blocker date and time   History of Anesthesia Complications Negative for: history of anesthetic complications  Airway Mallampati: II  TM Distance: >3 FB Neck ROM: Full    Dental  (+) Dental Advisory Given, Edentulous Upper, Missing   Pulmonary Current Smoker and Patient abstained from smoking.,  09/29/2019 SARS coronavirus NEG   breath sounds clear to auscultation       Cardiovascular hypertension, Pt. on medications and Pt. on home beta blockers (-) angina Rhythm:Regular Rate:Normal     Neuro/Psych Anxiety Depression H/o SDH 2020    GI/Hepatic negative GI ROS, Neg liver ROS,   Endo/Other  diabetes (glucose 151 ), Insulin DependentHypothyroidism obese  Renal/GU Renal InsufficiencyRenal disease (creat 2.31)     Musculoskeletal   Abdominal (+) + obese,   Peds  Hematology   Anesthesia Other Findings   Reproductive/Obstetrics                            Anesthesia Physical Anesthesia Plan  ASA: III  Anesthesia Plan: General   Post-op Pain Management:    Induction: Intravenous  PONV Risk Score and Plan: 1 and Ondansetron and Dexamethasone  Airway Management Planned: Oral ETT  Additional Equipment: Arterial line  Intra-op Plan:   Post-operative Plan: Extubation in OR  Informed Consent: I have reviewed the patients History and Physical, chart, labs and discussed the procedure including the risks, benefits and alternatives for the proposed anesthesia with the patient or authorized representative who has indicated his/her understanding and acceptance.     Dental advisory given  Plan Discussed with: CRNA and Surgeon  Anesthesia Plan Comments:        Anesthesia Quick  Evaluation

## 2019-10-01 NOTE — Anesthesia Postprocedure Evaluation (Signed)
Anesthesia Post Note  Patient: Johnny [name]  Procedure(s) Performed: ENDARTERECTOMY CAROTID; PATCH ANGIOPLASTY (Right Neck) PATCH ANGIOPLASTY USING LEMAITRE XENOSURE BIOLOGIC PATCH (Right Neck)     Patient location during evaluation: PACU Anesthesia Type: General Level of consciousness: awake and alert, patient cooperative and oriented Pain management: pain level controlled Vital Signs Assessment: post-procedure vital signs reviewed and stable Respiratory status: spontaneous breathing, nonlabored ventilation and respiratory function stable Cardiovascular status: blood pressure returned to baseline and stable Postop Assessment: no apparent nausea or vomiting Anesthetic complications: no   No complications documented.  Last Vitals:  Vitals:   10/01/19 1253 10/01/19 1300  BP: (!) 123/56 (!) 119/54  Pulse: 77 76  Resp: 20 20  Temp:  37.2 C  SpO2: 95% 95%    Last Pain:  Vitals:   10/01/19 1300  TempSrc:   PainSc: 0-No pain                 Darrell Hauk,E. Sandee Bernath

## 2019-10-01 NOTE — Transfer of Care (Signed)
Immediate Anesthesia Transfer of Care Note  Patient: Johnny [name]  Procedure(s) Performed: ENDARTERECTOMY CAROTID; PATCH ANGIOPLASTY (Right Neck) PATCH ANGIOPLASTY USING LEMAITRE XENOSURE BIOLOGIC PATCH (Right Neck)  Patient Location: PACU  Anesthesia Type:General  Level of Consciousness: awake, alert  and patient cooperative  Airway & Oxygen Therapy: Patient Spontanous Breathing and Patient connected to face mask oxygen  Post-op Assessment: Report given to RN, Post -op Vital signs reviewed and stable, Patient moving all extremities X 4 and Patient able to stick tongue midline  Post vital signs: Reviewed and stable  Last Vitals:  Vitals Value Taken Time  BP 148/84 10/01/19 1137  Temp    Pulse 88 10/01/19 1140  Resp 23 10/01/19 1140  SpO2 100 % 10/01/19 1140  Vitals shown include unvalidated device data.  Last Pain:  Vitals:   10/01/19 0739  TempSrc:   PainSc: 0-No pain      Patients Stated Pain Goal: 3 (95/97/47 1855)  Complications: No complications documented.

## 2019-10-01 NOTE — Progress Notes (Signed)
Pt received from PACU to 4e25. Oriented to room and call bell. A line intact. CHG bath complete. CCMD called, VSS. Call bell in reach. Will continue to monitor.  Arletta Bale, RN

## 2019-10-01 NOTE — Discharge Instructions (Signed)
   [facility]  Discharge Instructions   Carotid Surgery  Please refer to the following instructions for your post-procedure care. Your surgeon or physician assistant will discuss any changes with you.  Activity  You are encouraged to walk as much as you can. You can slowly return to normal activities but must avoid strenuous activity and heavy lifting until your doctor tell you it's okay. Avoid activities such as vacuuming or swinging a golf club. You can drive after one week if you are comfortable and you are no longer taking prescription pain medications. It is normal to feel tired for serval weeks after your surgery. It is also normal to have difficulty with sleep habits, eating, and bowel movements after surgery. These will go away with time.  Bathing/Showering  Shower daily after you go home. Do not soak in a bathtub, hot tub, or swim until the incision heals completely.  Incision Care  Shower every day. Clean your incision with mild soap and water. Pat the area dry with a clean towel. You do not need a bandage unless otherwise instructed. Do not apply any ointments or creams to your incision. You may have skin glue on your incision. Do not peel it off. It will come off on its own in about one week. Your incision may feel thickened and raised for several weeks after your surgery. This is normal and the skin will soften over time.   For Men Only: It's okay to shave around the incision but do not shave the incision itself for 2 weeks. It is common to have numbness under your chin that could last for several months.  Diet  Resume your normal diet. There are no special food restrictions following this procedure. A low fat/low cholesterol diet is recommended for all patients with vascular disease. In order to heal from your surgery, it is CRITICAL to get adequate nutrition. Your body requires vitamins, minerals, and protein. Vegetables are the best source of  vitamins and minerals. Vegetables also provide the perfect balance of protein. Processed food has little nutritional value, so try to avoid this.  Medications  Resume taking all of your medications unless your doctor or physician assistant tells you not to. If your incision is causing pain, you may take over-the- counter pain relievers such as acetaminophen (Tylenol). If you were prescribed a stronger pain medication, please be aware these medications can cause nausea and constipation. Prevent nausea by taking the medication with a snack or meal. Avoid constipation by drinking plenty of fluids and eating foods with a high amount of fiber, such as fruits, vegetables, and grains.   Do not take Tylenol if you are taking prescription pain medications.  Follow Up  Our office will schedule a follow up appointment 2-3 weeks following discharge.  Please call us immediately for any of the following conditions  . Increased pain, redness, drainage (pus) from your incision site. . Fever of 101 degrees or higher. . If you should develop stroke (slurred speech, difficulty swallowing, weakness on one side of your body, loss of vision) you should call 911 and go to the nearest emergency room. .  Reduce your risk of vascular disease:  . Stop smoking. If you would like help call [facility] at 1-800-QUIT-NOW (1-800-784-8669) or [place] at 336-586-4000. . Manage your cholesterol . Maintain a desired weight . Control your diabetes . Keep your blood pressure down .  If you have any questions, please call the office at [phone]. 

## 2019-10-01 NOTE — H&P (Signed)
History and Physical Interval Note:  10/01/2019 8:02 AM  Johnny [name]  has presented today for surgery, with the diagnosis of carotid stenosis.  The various methods of treatment have been discussed with the patient and family. After consideration of risks, benefits and other options for treatment, the patient has consented to  Procedure(s): ENDARTERECTOMY CAROTID (Right) as a surgical intervention.  The patient's history has been reviewed, patient examined, no change in status, stable for surgery.  I have reviewed the patient's chart and labs.  Questions were answered to the patient's satisfaction.    Right CEA for high grade asymptomatic stenosis.  High bifurcation and discussed risk of cranial nerve injury in addition to risk of anesthesia, bleeding/hematoma, 1% risk of perioperative stroke.    Marty Heck  Patient name: Johnny [name]           MRN: [id number]        DOB: 05-17-1950          Sex: male  REASON FOR VISIT: Re-evaluate for right carotid endarterectomy after recently cancelling surgery  HPI: Johnny [name] is a 69 y.o. male with history of diabetes, hypertension, hyperlipidemiathat presents to discuss previously planned right carotid endarterectomy.  He was initially referred back from the [place] earlier this year for evaluation of right carotid endarterectomy.  Patient was initially seen last year in June 2020.  We were consulted in the hospital for a high-grade right ICA stenosis that was asymptomatic.  He initially presented as a code stroke and was found to have chronic bilateral hygromas and an acute left parietal bleed.  Apparently fell and hit his head prior to this.  He was seen by neurosurgery and they had recommended delaying intervention for several weeks.  On follow-up last year we recommended right carotid endarterectomy but he wanted to delay intervention and get back to the [place]. [facility].  Surgery  was scheduled for May of this year.  Ultimately he canceled the operation as he stated he did not want to live any longer.  We ordered a welfare check.  He comes in today with his niece who is now living with.  States he has now decided to proceed with surgery.  Has had no neurologic events since last evaluation or this year.  No neck surgery or neck radiation.  Remains on aspirin statin.        Past Medical History:  Diagnosis Date  . Diabetes mellitus without complication ([place])   . Hyperlipemia   . Hypertension          Past Surgical History:  Procedure Laterality Date  . APPENDECTOMY      Family History  Family history unknown: Yes    SOCIAL HISTORY: Social History   Tobacco Use  . Smoking status: Never Smoker  . Smokeless tobacco: Never Used  Substance Use Topics  . Alcohol use: No    No Known Allergies        Current Outpatient Medications  Medication Sig Dispense Refill  . acetaminophen (TYLENOL) 650 MG CR tablet Take 1,300 mg by mouth 2 (two) times daily as needed for pain.    Marland Kitchen aspirin EC 81 MG tablet Take 81 mg by mouth daily.    Marland Kitchen atenolol (TENORMIN) 25 MG tablet Take 1 tablet (25 mg total) by mouth 2 (two) times daily. (Patient not taking: Reported on 05/20/2019)    . atorvastatin (LIPITOR) 40 MG  tablet Take 40 mg by mouth at bedtime.    . calcium carbonate (OS-CAL - DOSED IN MG OF ELEMENTAL CALCIUM) 1250 (500 Ca) MG tablet Take 500 mg by mouth daily.    . Cholecalciferol (VITAMIN D-3) 25 MCG (1000 UT) CAPS Take 1,000 Units by mouth daily with breakfast.    . CINNAMON PO Take 1,000 mg by mouth daily.     . diphenhydrAMINE HCl (ZZZQUIL) 50 MG/30ML LIQD Take 50 mg by mouth at bedtime as needed (sleep).    Marland Kitchen FLUoxetine (PROZAC) 10 MG tablet Take 30 mg by mouth daily.    Marland Kitchen glucose blood (PRECISION XTRA TEST STRIPS) test strip 1 each by Other route as needed (blood glucose level). Use as instructed    . insulin aspart (NOVOLOG)  100 UNIT/ML injection Inject 20-30 Units into the skin 2 (two) times daily with a meal.     . insulin glargine (LANTUS) 100 UNIT/ML injection Inject 50 Units into the skin every evening.     . Insulin Syringe-Needle U-100 (INSULIN SYRINGE .5CC/30GX1/2") 30G X 1/2" 0.5 ML MISC Inject 1 Syringe into the skin 4 (four) times daily.    Marland Kitchen levETIRAcetam (KEPPRA) 500 MG tablet Take 1 tablet (500 mg total) by mouth 2 (two) times daily. 60 tablet 4  . levothyroxine (SYNTHROID) 25 MCG tablet Take 25 mcg by mouth daily before breakfast.    . lisinopril (ZESTRIL) 20 MG tablet Take 1 tablet (20 mg total) by mouth daily.    . Multiple Vitamins-Minerals (ONE-A-DAY MENS 50+ ADVANTAGE) TABS Take 1 tablet by mouth daily with breakfast.    . ondansetron (ZOFRAN ODT) 4 MG disintegrating tablet Take 1 tablet (4 mg total) by mouth every 8 (eight) hours as needed for nausea or vomiting. 20 tablet 0   No current facility-administered medications for this visit.    REVIEW OF SYSTEMS:  [X]  denotes positive finding, [ ]  denotes negative finding Cardiac  Comments:  Chest pain or chest pressure:    Shortness of breath upon exertion:    Short of breath when lying flat:    Irregular heart rhythm:        Vascular    Pain in calf, thigh, or hip brought on by ambulation:    Pain in feet at night that wakes you up from your sleep:     Blood clot in your veins:    Leg swelling:         Pulmonary    Oxygen at home:    Productive cough:     Wheezing:         Neurologic    Sudden weakness in arms or legs:     Sudden numbness in arms or legs:     Sudden onset of difficulty speaking or slurred speech:    Temporary loss of vision in one eye:     Problems with dizziness:         Gastrointestinal    Blood in stool:     Vomited blood:         Genitourinary    Burning when urinating:     Blood in urine:        Psychiatric    Major  depression:         Hematologic    Bleeding problems:    Problems with blood clotting too easily:        Skin    Rashes or ulcers:        Constitutional    Fever or  chills:      PHYSICAL EXAM:    Vitals:   09/23/19 0818  Resp: 18  Weight: 200 lb (90.7 kg)  Height: 5\' 7"  (1.702 m)    GENERAL: The patient is a well-nourished male, in no acute distress. The vital signs are documented above. CARDIAC: There is a regular rate and rhythm.  PULMONARY: There is good air exchange bilaterally without wheezing or rales. ABDOMEN: Soft and non-tender with normal pitched bowel sounds.  MUSCULOSKELETAL: There are no major deformities or cyanosis. NEUROLOGIC: No focal weakness or paresthesias are detected.  CN II-XII grossly intact. SKIN: There are no ulcers or rashes noted. PSYCHIATRIC: The patient has a normal affect.  DATA:   I independently reviewed his CTA neck from last year again this shows a high-grade focal right ICA stenosis greater than 80%.  Assessment/Plan:  69 year old male presents to discuss high-grade asymptomatic right carotid stenosis.  I previously offered him a right carotid endarterectomy last year and he wanted to defer and have this done at the [place].  He came back as a referral earlier this year from the [place] and was scheduled for surgery back in May.  Ultimately he canceled surgery and stated he no longer wanted to live.  Before rescheduling his surgery again discussed I wanted to see him in the office. On evaluation today remains asymptomatic from a carotid standpoint.  He is here with his niece and seems to be in a better place.  He now wants to proceed with surgery.  We talked about the benefit of long-term stroke risk reduction.  Risk and benefits were discussed including risk of bleeding, infection, cranial nerve injury, perioperative stroke 1% risk.   I re-reviewed his CTA from last year when we were initially consulted and he clearly  has a high-grade stenosis.  All of his imaging at the [place] have also correlated with right ICA high-grade stenosis.  I have offered right carotid endarterectomy.  We will schedule surgery today.  I do want to get a updated carotid duplex to ensure this carotid remains patent prior to surgery given his imaging is now 18 months old.   Marty Heck, MD [facility] [place] Office: [phone]

## 2019-10-01 NOTE — Op Note (Signed)
OPERATIVE NOTE  PROCEDURE:   1.  right carotid endarterectomy with bovine patch angioplasty 2.  right intraoperative carotid ultrasound  PRE-OPERATIVE DIAGNOSIS: right asymptomatic high grade carotid stenosis (>80%)  POST-OPERATIVE DIAGNOSIS: same as above   SURGEON: Marty Heck, MD  ASSISTANT(S): Leontine Locket, PA  ANESTHESIA: general  ESTIMATED BLOOD LOSS: Minimal  FINDING(S): 1.  High grade calcified right carotid plaque. 2.  Continuous Doppler audible flow signatures are appropriate for each carotid artery after endarterectomy. 3.  No evidence of intimal flap visualized on transverse or longitudinal ultrasonography.    SPECIMEN(S):  Carotid plaque (sent to Pathology)  INDICATIONS:   Johnny [name] is a 69 y.o. male who presents with right asymptomatic carotid stenosis >80% with high carotid bifurcation.  I discussed with the patient the risks, benefits, and alternatives to carotid endarterectomy.  I discussed the procedural details of carotid endarterectomy with the patient.  The patient is aware that the risks of carotid endarterectomy include but are not limited to: bleeding, infection, stroke, myocardial infarction, death, cranial nerve injuries both temporary and permanent, neck hematoma, possible airway compromise, labile blood pressure post-operatively, cerebral hyperperfusion syndrome, and possible need for additional interventions in the future. The patient is aware of the risks and agrees to proceed forward with the procedure.  An assistant was needed for exposure and to expedite the case.    DESCRIPTION: After full informed written consent was obtained from the patient, the patient was brought back to the operating room and placed supine upon the operating table.  Prior to induction, the patient received IV antibiotics.  After obtaining adequate anesthesia, the patient was placed into semi-Fowler position with a shoulder roll in place and the patient's neck  slightly hyperextended and rotated away from the surgical site.  The patient was prepped in the standard fashion for a right carotid endarterectomy.  I made an incision anterior to the sternocleidomastoid muscle and dissected down through the subcutaneous tissue.  The platysma was opened with electrocautery.  I then used Bovie cautery and blunt dissection to dissect through the underlying platysma and to mobilize the anterior border of the sternocleidomastoid as well as the internal jugular vein laterally.  The facial vein was ligated with 3-0 silk and surgical clips and divided.  After identifying the carotid artery I used Metzenbaum scissors to bluntly dissect the common carotid artery and then controlled this with both a large vessel loop and a umbilical tape.  At this point in time the patient was given 100 units/kg of IV heparin and we checked an ACT to ensure it was greater than 250.  I then carried my dissection cephalad and mobilized the external carotid artery and superior thyroid artery and controlled each of these with a vessel loop.  I then dissected out the internal carotid artery well past the distal plaque.  The internal carotid artery was then controlled with a umbilical tape as well. I was careful to identify the vagus nerve between the internal jugular and common carotid and this was presereved.  I was also careful to identify and preserve the hypoglossal nerve and this was preserved.    Once our ACT was confirmed, I proceeded by clamping the internal carotid artery with a angled bulldog clamp first.  The proximal common carotid artery was controlled with a angled debakey clamp.  The external carotid was controlled with a vessel loop.  I subsequently opened the common carotid artery with an 11 blade scalpel in longitudinal fashion and extended  the arteriotomy with Potts scissors onto the ICA past the distal plaque.   I then used a Garment/textile technologist and performed a endarterectomy starting in the  common carotid artery.  The external carotid artery was endarterectomized with an eversion technique and I was careful to feather the distal ICA plaque.  The specimen was passed off the field. I had to extend the arteriotomy to get better visualization of distal plaque in the ICA and this was already a high bifurcation.  I elected not to place a shunt given good back bleeding from internal carotid and such a high exposure already.  The endarterectomy site was flushed with heparinized saline.    I then brought a bovine carotid patch on the field and this was sewn in place with a running anastomosis using a 6-0 Prolene distally and a 5-0 proximal.  The bovine patch was trimmed accordingly.  Once the patch was complete, I flushed up the external carotid artery first prior to releasing the internal carotid artery clamp.  An intraoperative duplex was performed that showed  no evidence of any flaps.  A doppler was used to check signals in the internal and external carotid artery with good signals.  Once I was happy with the intraoperative ultrasound the patient was given protamine for reversal.  I used surgical snow to get hemostasis around the patch.  Ultimately the platysma was closed in running fashion with 3-0 Vicryl.  The skin was closed with a running 4-0 Monocryl.  Dermabond was applied with a dry sterile dressing.  The patient was awakened from anesthesia with no new neurological deficit and taken to PACU in stable condition.    COMPLICATIONS: None  CONDITION: Stable  Marty Heck, MD [facility]: [phone]  10/01/2019, 11:21 AM

## 2019-10-01 NOTE — Anesthesia Procedure Notes (Signed)
Procedure Name: Intubation Performed by: Milford Cage, CRNA Pre-anesthesia Checklist: Patient identified, Emergency Drugs available, Suction available and Patient being monitored Patient Re-evaluated:Patient Re-evaluated prior to induction Oxygen Delivery Method: Circle System Utilized Preoxygenation: Pre-oxygenation with 100% oxygen Induction Type: IV induction Ventilation: Mask ventilation without difficulty and Oral airway inserted - appropriate to patient size Laryngoscope Size: Miller and 2 Grade View: Grade I Tube type: Oral Tube size: 7.0 mm Number of attempts: 1 Airway Equipment and Method: Stylet and Oral airway Placement Confirmation: ETT inserted through vocal cords under direct vision,  positive ETCO2 and breath sounds checked- equal and bilateral Secured at: 22 cm Tube secured with: Tape Dental Injury: Teeth and Oropharynx as per pre-operative assessment

## 2019-10-01 NOTE — Plan of Care (Signed)
  Problem: Health Behavior/Discharge Planning: Goal: Ability to manage health-related needs will improve Outcome: Progressing   Problem: Activity: Goal: Risk for activity intolerance will decrease Outcome: Progressing   

## 2019-10-01 NOTE — Progress Notes (Signed)
  Day of Surgery Note    Subjective:  C/o right jaw pain   Vitals:   10/01/19 1300 10/01/19 1349  BP: (!) 119/54 (!) 111/59  Pulse: 76 75  Resp: 20 20  Temp: 99 F (37.2 C) 98.1 F (36.7 C)  SpO2: 95% 96%   Neuro: A and O x 4. Speech fluent. Tongue midline and face symmetrical Incisions:   Right neck incision well approximated without hematoma  Extremities:  Moves all well. 5/5 hang grip strength Cardiac:  RRR Lungs:  CTAB Abdomen:  soft   Assessment/Plan:  This is a 69 y.o. male who is s/p right CEA. Hemodynamically stable and neuro intact.  -Cont care plan and monitoring   Risa Grill, PA-C 10/01/2019 2:13 PM [phone]

## 2019-10-01 NOTE — Progress Notes (Signed)
Dr. Carlis Abbott paged about patients suicide assessment this morning.  Patient denies having thoughts of harming him self or killing himself today but has has those thoughts in the past.  Patient does not have a plan.   Spoke with Dr. Carlis Abbott who stated that this is a chronic and that todays assessment is an improvement from a few weeks ago.  Plan is to proceed with surgery

## 2019-10-01 NOTE — Anesthesia Procedure Notes (Signed)
Arterial Line Insertion Start/End8/25/2021 8:46 AM, 10/01/2019 8:49 AM Performed by: Annye Asa, MD, anesthesiologist  Patient location: Pre-op. Preanesthetic checklist: patient identified, IV checked, site marked, risks and benefits discussed, surgical consent, monitors and equipment checked, pre-op evaluation, timeout performed and anesthesia consent Lidocaine 1% used for infiltration Left, radial was placed Catheter size: 20 G Hand hygiene performed , maximum sterile barriers used  and Seldinger technique used Allen's test indicative of satisfactory collateral circulation Attempts: 1 (after CRNA unsuccessful) Procedure performed without using ultrasound guided technique. Following insertion, dressing applied and Biopatch. Post procedure assessment: normal  Patient tolerated the procedure well with no immediate complications.

## 2019-10-02 ENCOUNTER — Encounter (HOSPITAL_COMMUNITY): Payer: Self-pay | Admitting: Vascular Surgery

## 2019-10-02 LAB — BASIC METABOLIC PANEL
Anion gap: 10 (ref 5–15)
BUN: 44 mg/dL — ABNORMAL HIGH (ref 8–23)
CO2: 18 mmol/L — ABNORMAL LOW (ref 22–32)
Calcium: 8.7 mg/dL — ABNORMAL LOW (ref 8.9–10.3)
Chloride: 109 mmol/L (ref 98–111)
Creatinine, Ser: 2.4 mg/dL — ABNORMAL HIGH (ref 0.61–1.24)
GFR calc Af Amer: 31 mL/min — ABNORMAL LOW (ref >60)
GFR calc non Af Amer: 26 mL/min — ABNORMAL LOW (ref >60)
Glucose, Bld: 211 mg/dL — ABNORMAL HIGH (ref 70–99)
Potassium: 4.8 mmol/L (ref 3.5–5.1)
Sodium: 137 mmol/L (ref 135–145)

## 2019-10-02 LAB — CBC
HCT: 39.6 % (ref 39.0–52.0)
Hemoglobin: 13 g/dL (ref 13.0–17.0)
MCH: 30.4 pg (ref 26.0–34.0)
MCHC: 32.8 g/dL (ref 30.0–36.0)
MCV: 92.5 fL (ref 80.0–100.0)
Platelets: 206 10*3/uL (ref 150–400)
RBC: 4.28 MIL/uL (ref 4.22–5.81)
RDW: 12.9 % (ref 11.5–15.5)
WBC: 10.3 10*3/uL (ref 4.0–10.5)
nRBC: 0 % (ref 0.0–0.2)

## 2019-10-02 LAB — GLUCOSE, CAPILLARY: Glucose-Capillary: 197 mg/dL — ABNORMAL HIGH (ref 70–99)

## 2019-10-02 MED ORDER — OXYCODONE-ACETAMINOPHEN 5-325 MG PO TABS
1.0000 | ORAL_TABLET | Freq: Four times a day (QID) | ORAL | 0 refills | Status: DC | PRN
Start: 2019-10-02 — End: 2022-05-02

## 2019-10-02 NOTE — Plan of Care (Signed)
  Problem: Education: Goal: Knowledge of General Education information will improve Description: Including pain rating scale, medication(s)/side effects and non-pharmacologic comfort measures Outcome: Adequate for Discharge   

## 2019-10-02 NOTE — Progress Notes (Signed)
Discharge instructions (including medications) discussed with and copy provided to patient/caregiver 

## 2019-10-02 NOTE — Progress Notes (Addendum)
  Progress Note    10/02/2019 7:22 AM 1 Day Post-Op  Subjective:  No complaints; denies trouble swallowing.  Has voided.   Afebrile HR 50's-90's NSR 17'R-116'F systolic 79% RA  Gtts:  None  Vitals:   10/02/19 0400 10/02/19 0445  BP: (!) 117/56 (!) 117/56  Pulse: 68 68  Resp: 19 19  Temp: 97.7 F (36.5 C) 97.7 F (36.5 C)  SpO2: 97% 97%     Physical Exam: Neuro:  In tact; moving all extremities equally.  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 Lungs:  Non labored Incision:  Clean and dry without hematoma  CBC    Component Value Date/Time   WBC 10.3 10/02/2019 0415   RBC 4.28 10/02/2019 0415   HGB 13.0 10/02/2019 0415   HCT 39.6 10/02/2019 0415   PLT 206 10/02/2019 0415   MCV 92.5 10/02/2019 0415   MCH 30.4 10/02/2019 0415   MCHC 32.8 10/02/2019 0415   RDW 12.9 10/02/2019 0415   LYMPHSABS 5.4 (H) 07/26/2018 1649   MONOABS 0.8 07/26/2018 1649   EOSABS 0.2 07/26/2018 1649   BASOSABS 0.1 07/26/2018 1649    BMET    Component Value Date/Time   NA 137 10/02/2019 0415   K 4.8 10/02/2019 0415   CL 109 10/02/2019 0415   CO2 18 (L) 10/02/2019 0415   GLUCOSE 211 (H) 10/02/2019 0415   BUN 44 (H) 10/02/2019 0415   CREATININE 2.40 (H) 10/02/2019 0415   CALCIUM 8.7 (L) 10/02/2019 0415   GFRNONAA 26 (L) 10/02/2019 0415   GFRAA 31 (L) 10/02/2019 0415     Intake/Output Summary (Last 24 hours) at 10/02/2019 0722 Last data filed at 10/02/2019 0527 Gross per 24 hour  Intake 1762.36 ml  Output 100 ml  Net 1662.36 ml     Assessment/Plan:  This is a 69 y.o. male who is s/p right CEA 1 Day Post-Op  -pt is doing well this am. -pt neuro exam is in tact -pt has not ambulated-needs to walk  -renal function stable -pt has voided -discharge home and f/u with Dr. Carlis Abbott in 2-3 weeks. -PDMP reviewed.    Leontine Locket, PA-C [facility] [phone]  I have seen and evaluated the patient. I agree with the PA note as documented above.   Postop day 1 status post right carotid endarterectomy for asymptomatic high-grade stenosis.  Neck incision looks good.  Neurologically intact.  Eating and drinking without issue.  A-line has been removed.  Hemodynamics are okay.  Creatinine at baseline given CKD.  Assuming he can ambulate independently we will plan for discharge.  Follow-up in 3 to 4 weeks with me in the office for wound check.  Discussed that he continue his aspirin.  Marty Heck, MD [facility] [place] Office: [phone]

## 2019-10-02 NOTE — Discharge Summary (Signed)
Discharge Summary     Johnny [name] 1950-03-14 69 y.o. male  [id number]  Admission Date: 10/01/2019  Discharge Date: 10/02/2019  Physician: Marty Heck, MD  Admission Diagnosis: Carotid artery stenosis, symptomatic, right [I65.21]   HPI:   This is a 69 y.o. male with history of diabetes, hypertension, hyperlipidemiathat presentsto discuss previously planned right carotid endarterectomy. [facility]. Patient was initially seen last year in June 2020. We were consulted in the hospital for a high-grade right ICA stenosis that was asymptomatic. He initially presented as a code stroke and was found to have chronic bilateral hygromas and an acute left parietal bleed. Apparently fell and hit his head prior to this. He was seen by neurosurgery and they had recommended delaying intervention for several weeks. On follow-up last year we recommended right carotid endarterectomy but he wanted to delay intervention and get back to the [place]. [facility].  Surgery was scheduled for May of this year. Ultimately he canceled the operation as he stated hedid not wanttolive any longer. We ordered a welfare check. He comes in today with his niece who is now living with. States he has now decided to proceed with surgery. Has had no neurologic events since last evaluation or this year. No neck surgery or neck radiation. Remains on aspirin statin.  Hospital Course:  The patient was admitted to the hospital and taken to the operating room on 10/01/2019 and underwent right carotid endarterectomy.    Findings: 1.  High grade calcified right carotid plaque. 2.  Continuous Doppler audible flow signatures are appropriate for each carotid artery after endarterectomy. 3.  No evidence of intimal flap visualized on transverse or  longitudinal ultrasonography.  The pt tolerated the procedure well and was transported to the PACU in good condition.   By POD 1, the pt neuro status was in tact.  He was doing well  Incision clean and dry. He was not having any swallowing issues.  His creatinine was ok and at baseline given CKD.  He was able to ambulate.  He was discharged home.   The remainder of the hospital course consisted of increasing mobilization and increasing intake of solids without difficulty.   Recent Labs    10/02/19 0415  NA 137  K 4.8  CL 109  CO2 18*  GLUCOSE 211*  BUN 44*  CALCIUM 8.7*   Recent Labs    10/02/19 0415  WBC 10.3  HGB 13.0  HCT 39.6  PLT 206   No results for input(s): INR in the last 72 hours.   Discharge Instructions    Discharge patient   Complete by: As directed    Discharge disposition: 01-Home or Self Care   Discharge patient date: 10/02/2019      Discharge Diagnosis:  Carotid artery stenosis, symptomatic, right [I65.21]  Secondary Diagnosis: Patient Active Problem List   Diagnosis Date Noted  . Carotid artery stenosis, symptomatic, right 10/01/2019  . Internal carotid artery stenosis 07/27/2018  . HTN (hypertension) 07/27/2018  . Hypoglycemia due to insulin 07/27/2018  . Type 2 diabetes mellitus ([place]) 07/27/2018  . Subdural hematoma ([place]) 07/26/2018   Past Medical History:  Diagnosis Date  . Anxiety   . Chronic kidney disease   . Depression   . Diabetes mellitus without complication (HCC)    Type 1  . Hyperlipemia   .  Hypertension   . Hypothyroidism   . SDH (subdural hematoma) ([place]) 07/26/2018   from fall  . Sleep apnea     Allergies as of 10/02/2019   No Known Allergies     Medication List    TAKE these medications   acetaminophen 650 MG CR tablet Commonly known as: TYLENOL Take 650 mg by mouth every 8 (eight) hours as needed for pain.   aspirin EC 81 MG tablet Take 81 mg by mouth daily.   atenolol 25 MG tablet Commonly known as:  TENORMIN Take 1 tablet (25 mg total) by mouth 2 (two) times daily.   atorvastatin 40 MG tablet Commonly known as: LIPITOR Take 40 mg by mouth at bedtime.   calcium carbonate 1500 (600 Ca) MG Tabs tablet Commonly known as: OSCAL Take 600 mg by mouth daily.   CINNAMON PO Take 1,000 mg by mouth daily.   FLUoxetine 10 MG tablet Commonly known as: PROZAC Take 30 mg by mouth daily.   insulin aspart 100 UNIT/ML injection Commonly known as: novoLOG Inject 20 Units into the skin 3 (three) times daily with meals.   insulin glargine 100 UNIT/ML injection Commonly known as: LANTUS Inject 50 Units into the skin at bedtime.   INSULIN SYRINGE .5CC/30GX1/2" 30G X 1/2" 0.5 ML Misc Inject 1 Syringe into the skin 4 (four) times daily.   levETIRAcetam 500 MG tablet Commonly known as: Keppra Take 1 tablet (500 mg total) by mouth 2 (two) times daily.   levothyroxine 25 MCG tablet Commonly known as: SYNTHROID Take 25 mcg by mouth daily before breakfast.   lisinopril 10 MG tablet Commonly known as: ZESTRIL Take 30 mg by mouth daily. What changed: Another medication with the same name was removed. Continue taking this medication, and follow the directions you see here.   ondansetron 4 MG disintegrating tablet Commonly known as: Zofran ODT Take 1 tablet (4 mg total) by mouth every 8 (eight) hours as needed for nausea or vomiting.   One-A-Day Mens 50+ Advantage Tabs Take 1 tablet by mouth daily with breakfast.   oxyCODONE-acetaminophen 5-325 MG tablet Commonly known as: Percocet Take 1 tablet by mouth every 6 (six) hours as needed.   PRECISION XTRA TEST STRIPS test strip Generic drug: glucose blood 1 each by Other route as needed (blood glucose level). Use as instructed   tamsulosin 0.4 MG Caps capsule Commonly known as: FLOMAX Take 0.4 mg by mouth daily.   Vitamin D-3 25 MCG (1000 UT) Caps Take 1,000 Units by mouth daily with breakfast.   ZzzQuil 50 MG/30ML Liqd Generic drug:  diphenhydrAMINE HCl Take 50 mg by mouth at bedtime as needed (sleep).        [facility] Discharge Instructions Carotid Endarterectomy (CEA)  Please refer to the following instructions for your post-procedure care. Your surgeon or physician assistant will discuss any changes with you.  Activity  You are encouraged to walk as much as you can. You can slowly return to normal activities but must avoid strenuous activity and heavy lifting until your doctor tell you it's OK. Avoid activities such as vacuuming or swinging a golf club. You can drive after one week if you are comfortable and you are no longer taking prescription pain medications. It is normal to feel tired for serval weeks after your surgery. It is also normal to have difficulty with sleep habits, eating, and bowel movements after surgery. These will go away with time.  Bathing/Showering  You may shower after  you come home. Do not soak in a bathtub, hot tub, or swim until the incision heals completely.  Incision Care  Shower every day. Clean your incision with mild soap and water. Pat the area dry with a clean towel. You do not need a bandage unless otherwise instructed. Do not apply any ointments or creams to your incision. You may have skin glue on your incision. Do not peel it off. It will come off on its own in about one week. Your incision may feel thickened and raised for several weeks after your surgery. This is normal and the skin will soften over time. For Men Only: It's OK to shave around the incision but do not shave the incision itself for 2 weeks. It is common to have numbness under your chin that could last for several months.  Diet  Resume your normal diet. There are no special food restrictions following this procedure. A low fat/low cholesterol diet is recommended for all patients with vascular disease. In order to heal from your surgery, it is CRITICAL to get adequate nutrition.  Your body requires vitamins, minerals, and protein. Vegetables are the best source of vitamins and minerals. Vegetables also provide the perfect balance of protein. Processed food has little nutritional value, so try to avoid this.  Medications  Resume taking all of your medications unless your doctor or physician assistant tells you not to.  If your incision is causing pain, you may take over-the- counter pain relievers such as acetaminophen (Tylenol). If you were prescribed a stronger pain medication, please be aware these medications can cause nausea and constipation.  Prevent nausea by taking the medication with a snack or meal. Avoid constipation by drinking plenty of fluids and eating foods with a high amount of fiber, such as fruits, vegetables, and grains.  Do not take Tylenol if you are taking prescription pain medications.  Follow Up  Our office will schedule a follow up appointment 2-3 weeks following discharge.  Please call us immediately for any of the following conditions  . Increased pain, redness, drainage (pus) from your incision site. . Fever of 101 degrees or higher. . If you should develop stroke (slurred speech, difficulty swallowing, weakness on one side of your body, loss of vision) you should call 911 and go to the nearest emergency room. .  Reduce your risk of vascular disease:  . Stop smoking. If you would like help call [facility] at 1-800-QUIT-NOW 731-280-4466) or [place] at [phone]. . Manage your cholesterol . Maintain a desired weight . Control your diabetes . Keep your blood pressure down .  If you have any questions, please call the office at [phone].  Prescriptions given: 1.   Roxicet #8 No Refill  Disposition: home  Patient's condition: is Good  Follow up: 1. Dr. Carlis Abbott in 2 weeks.   Leontine Locket, PA-C [facility] [phone]   --- For [facility] use ---   Modified Rankin score at D/C (0-6): 0  IV  medication needed for:  1. Hypertension: No 2. Hypotension: No  Post-op Complications: No  1. Post-op CVA or TIA: No  If yes: Event classification (right eye, left eye, right cortical, left cortical, verterobasilar, other): n/a  If yes: Timing of event (intra-op, <6 hrs post-op, >=6 hrs post-op, unknown): n/a  2. CN injury: No  If yes: CN n/a injuried   3. Myocardial infarction: No  If yes: Dx by (EKG or clinical, Troponin): n/a  4.  CHF: No  5.  Dysrhythmia (new): No  6. Wound infection: No  7. Reperfusion symptoms: No  8. Return to OR: No  If yes: return to OR for (bleeding, neurologic, other CEA incision, other): n/a  Discharge medications: Statin use:  Yes ASA use:  Yes   Beta blocker use:  No ACE-Inhibitor use:  Yes  ARB use:  No CCB use: No P2Y12 Antagonist use: No, [ ]  Plavix, [ ]  Plasugrel, [ ]  Ticlopinine, [ ]  Ticagrelor, [ ]  Other, [ ]  No for medical reason, [ ]  Non-compliant, [ ]  Not-indicated Anti-coagulant use:  No, [ ]  Warfarin, [ ]  Rivaroxaban, [ ]  Dabigatran,

## 2019-10-06 ENCOUNTER — Encounter (HOSPITAL_COMMUNITY): Payer: Self-pay | Admitting: Vascular Surgery

## 2019-10-21 ENCOUNTER — Other Ambulatory Visit: Payer: Self-pay

## 2019-10-21 ENCOUNTER — Ambulatory Visit (INDEPENDENT_AMBULATORY_CARE_PROVIDER_SITE_OTHER): Payer: Self-pay | Admitting: Vascular Surgery

## 2019-10-21 ENCOUNTER — Encounter: Payer: Self-pay | Admitting: Vascular Surgery

## 2019-10-21 VITALS — BP 114/70 | HR 89 | Temp 97.8°F | Resp 18 | Ht 67.0 in | Wt 199.0 lb

## 2019-10-21 DIAGNOSIS — I6521 Occlusion and stenosis of right carotid artery: Secondary | ICD-10-CM

## 2019-10-21 NOTE — Progress Notes (Signed)
Patient name: Johnny [name] MRN: [id number] DOB: [birth year] Sex: male  REASON FOR VISIT: Postop check after right carotid endarterectomy  HPI: Johnny [name] is a 69 y.o. male that presents for postop check after right carotid endarterectomy on 10/01/19 for a right asymptomatic high-grade stenosis >80%.  He had no complications and was discharged postop day 1.  On follow-up today he has no concerns and has been recovering at home without any issues.  He is taking aspirin and statin.  He had minimal 1 to 39 percent stenosis on the contralateral left side.  Past Medical History:  Diagnosis Date  . Anxiety   . Chronic kidney disease   . Depression   . Diabetes mellitus without complication (HCC)    Type 1  . Hyperlipemia   . Hypertension   . Hypothyroidism   . SDH (subdural hematoma) ([place]) 07/26/2018   from fall  . Sleep apnea     Past Surgical History:  Procedure Laterality Date  . APPENDECTOMY    . ENDARTERECTOMY Right 10/01/2019   Procedure: ENDARTERECTOMY CAROTID; PATCH ANGIOPLASTY;  Surgeon: Marty Heck, MD;  Location: [place];  Service: Vascular;  Laterality: Right;  . EYE SURGERY    . PATCH ANGIOPLASTY Right 10/01/2019   Procedure: PATCH ANGIOPLASTY USING [place];  Surgeon: Marty Heck, MD;  [facility];  Service: Vascular;  Laterality: Right;    Family History  Family history unknown: Yes    SOCIAL HISTORY: Social History   Tobacco Use  . Smoking status: Current Every Day Smoker    Types: Pipe  . Smokeless tobacco: Never Used  Substance Use Topics  . Alcohol use: No    No Known Allergies  Current Outpatient Medications  Medication Sig Dispense Refill  . acetaminophen (TYLENOL) 650 MG CR tablet Take 650 mg by mouth every 8 (eight) hours as needed for pain.     Marland Kitchen aspirin EC 81 MG tablet Take 81 mg by mouth daily.    Marland Kitchen atorvastatin (LIPITOR) 40 MG tablet Take 40 mg by mouth at bedtime.    . calcium carbonate (OSCAL)  1500 (600 Ca) MG TABS tablet Take 600 mg by mouth daily.     . Cholecalciferol (VITAMIN D-3) 25 MCG (1000 UT) CAPS Take 1,000 Units by mouth daily with breakfast.    . CINNAMON PO Take 1,000 mg by mouth daily.     . diphenhydrAMINE HCl (ZZZQUIL) 50 MG/30ML LIQD Take 50 mg by mouth at bedtime as needed (sleep).     Marland Kitchen FLUoxetine (PROZAC) 10 MG tablet Take 30 mg by mouth daily.    Marland Kitchen glucose blood (PRECISION XTRA TEST STRIPS) test strip 1 each by Other route as needed (blood glucose level). Use as instructed    . insulin aspart (NOVOLOG) 100 UNIT/ML injection Inject 20 Units into the skin 3 (three) times daily with meals.     . insulin glargine (LANTUS) 100 UNIT/ML injection Inject 50 Units into the skin at bedtime.     . Insulin Syringe-Needle U-100 (INSULIN SYRINGE .5CC/30GX1/2") 30G X 1/2" 0.5 ML MISC Inject 1 Syringe into the skin 4 (four) times daily.    Marland Kitchen levETIRAcetam (KEPPRA) 500 MG tablet Take 1 tablet (500 mg total) by mouth 2 (two) times daily. 60 tablet 4  . levothyroxine (SYNTHROID) 25 MCG tablet Take 25 mcg by mouth daily before breakfast.    . lisinopril (ZESTRIL) 10 MG tablet Take 30 mg by mouth daily.    . Multiple  Vitamins-Minerals (ONE-A-DAY MENS 50+ ADVANTAGE) TABS Take 1 tablet by mouth daily with breakfast.    . tamsulosin (FLOMAX) 0.4 MG CAPS capsule Take 0.4 mg by mouth daily.    Marland Kitchen atenolol (TENORMIN) 25 MG tablet Take 1 tablet (25 mg total) by mouth 2 (two) times daily. (Patient not taking: Reported on 10/21/2019)    . ondansetron (ZOFRAN ODT) 4 MG disintegrating tablet Take 1 tablet (4 mg total) by mouth every 8 (eight) hours as needed for nausea or vomiting. (Patient not taking: Reported on 10/21/2019) 20 tablet 0  . oxyCODONE-acetaminophen (PERCOCET) 5-325 MG tablet Take 1 tablet by mouth every 6 (six) hours as needed. (Patient not taking: Reported on 10/21/2019) 8 tablet 0   No current facility-administered medications for this visit.    REVIEW OF SYSTEMS:  [X]  denotes  positive finding, [ ]  denotes negative finding Cardiac  Comments:  Chest pain or chest pressure:    Shortness of breath upon exertion:    Short of breath when lying flat:    Irregular heart rhythm:        Vascular    Pain in calf, thigh, or hip brought on by ambulation:    Pain in feet at night that wakes you up from your sleep:     Blood clot in your veins:    Leg swelling:         Pulmonary    Oxygen at home:    Productive cough:     Wheezing:         Neurologic    Sudden weakness in arms or legs:     Sudden numbness in arms or legs:     Sudden onset of difficulty speaking or slurred speech:    Temporary loss of vision in one eye:     Problems with dizziness:         Gastrointestinal    Blood in stool:     Vomited blood:         Genitourinary    Burning when urinating:     Blood in urine:        Psychiatric    Major depression:         Hematologic    Bleeding problems:    Problems with blood clotting too easily:        Skin    Rashes or ulcers:        Constitutional    Fever or chills:      PHYSICAL EXAM: Vitals:   10/21/19 0812 10/21/19 0816  BP: 111/70 114/70  Pulse: 89 89  Resp: 18   Temp: 97.8 F (36.6 C)   SpO2: 96%   Weight: 199 lb (90.3 kg)   Height: 5\' 7"  (1.702 m)     GENERAL: The patient is a well-nourished male, in no acute distress. The vital signs are documented above. CARDIAC: There is a regular rate and rhythm.  VASCULAR:  Right neck incision clean dry and intact.  No hematoma.  No signs of infection. PULMONARY: There is good air exchange bilaterally without wheezing or rales. ABDOMEN: Soft and non-tender with normal pitched bowel sounds.  MUSCULOSKELETAL: There are no major deformities or cyanosis. NEUROLOGIC: No focal weakness or paresthesias are detected.  Cranial nerves II through XII grossly intact   DATA:    None  Assessment/Plan:  69 year old male status post right carotid endarterectomy on 10/01/19 for an asymptomatic  high-grade stenosis.  He has recovered very well and is neurologically intact.  Incision looks really  good.  Discussed that he stay on aspirin and statin for risk reduction.  We will plan to see him in 9 months with carotid duplex here in the office for further surveillance.  He knows to call with questions or concerns.   Marty Heck, MD [facility] [place] Office: [phone]

## 2019-10-24 ENCOUNTER — Other Ambulatory Visit: Payer: Self-pay | Admitting: *Deleted

## 2019-10-24 DIAGNOSIS — I6521 Occlusion and stenosis of right carotid artery: Secondary | ICD-10-CM

## 2020-08-03 IMAGING — CT CT ANGIOGRAPHY NECK
1 of 8 series · 6 of 33 positions shown · IV contrast (APPLIED)
Comparison: Code stroke CT head earlier today.

CLINICAL DATA: Unsteady gait. Slurred speech.

EXAM:
CT ANGIOGRAPHY HEAD AND NECK
TECHNIQUE: Multidetector CT imaging of the head and neck was performed using
the standard protocol during bolus administration of intravenous
contrast. Multiplanar CT image reconstructions and MIPs were
obtained to evaluate the vascular anatomy. Carotid stenosis
measurements (when applicable) are obtained utilizing NASCET
criteria, using the distal internal carotid diameter as the
denominator.
CONTRAST:  75mL OMNIPAQUE IOHEXOL 350 MG/ML SOLN

[Series 7: ax thins · axial · 0.42mm/px · z∈[-276,-16]mm · 6 of 363 slices shown]
[im 52/363  soft-tissue]
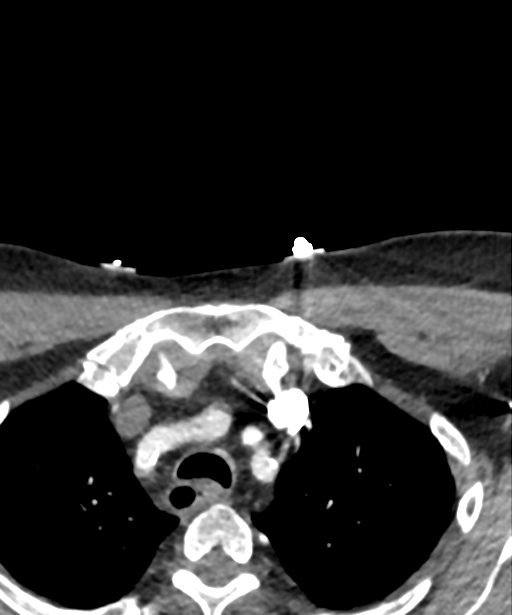
[im 104/363  bone]
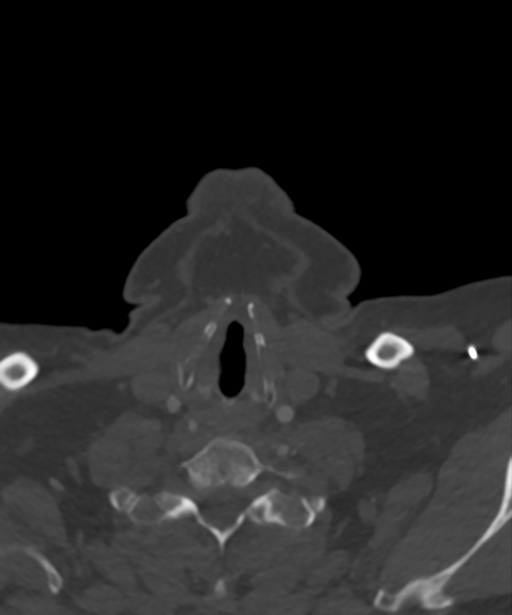
[im 156/363  soft-tissue]
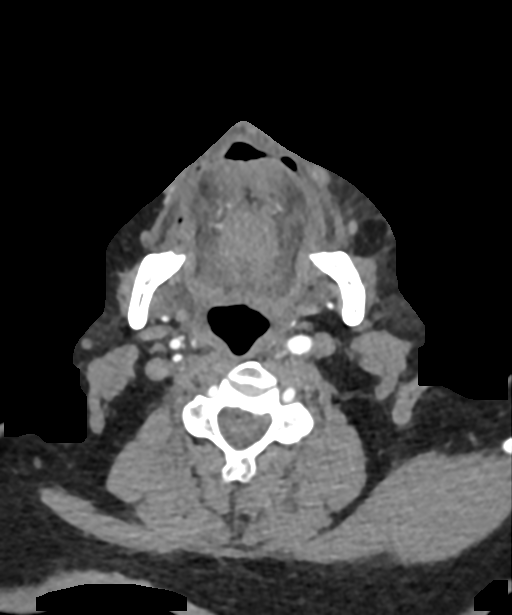
[im 207/363  bone]
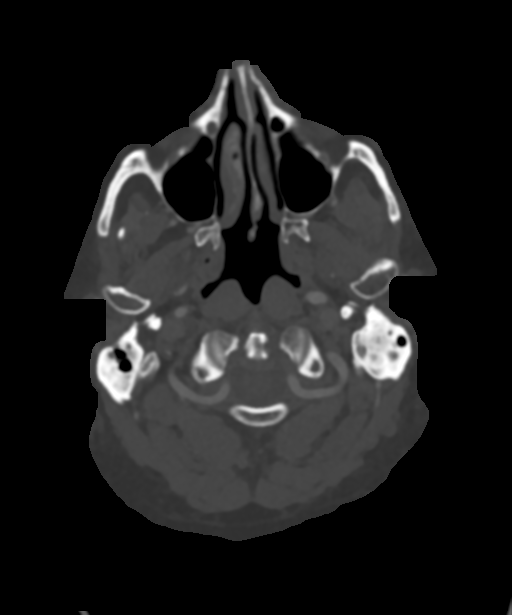
[im 259/363  soft-tissue]
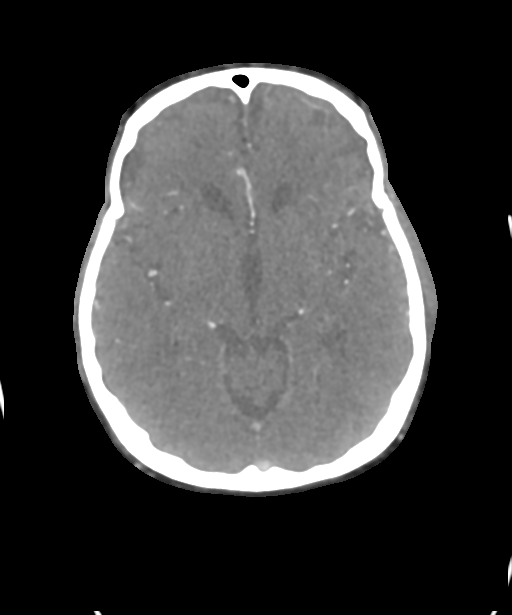
[im 311/363  bone]
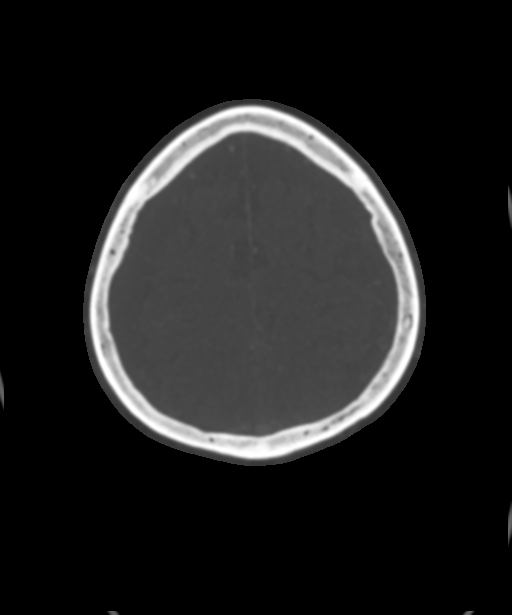

[6 of 33 positions shown; findings below may reference images not displayed]

FINDINGS: CTA NECK FINDINGS

Aortic arch: Standard branching. Imaged portion shows no evidence of
aneurysm or dissection. No significant stenosis of the major arch
vessel origins. Aortic atherosclerosis.

Right carotid system: Severe proximal RIGHT ICA stenosis, luminal
narrowing to 1 mm, estimated 75-90%, possibly greater. This is
related to calcific and noncalcified plaque in the proximal RIGHT
ICA. No dissection, although the cervical ICA on the RIGHT is
decreased in caliber reflecting the high-grade proximal stenosis.
Estimated 50-75% stenosis of the proximal ECA. Nonstenotic plaque
mid common carotid artery.

Left carotid system: No evidence of dissection, stenosis (50% or
greater) or occlusion. Calcific and noncalcified plaque.

Vertebral arteries: Codominant. No evidence of dissection, stenosis
(50% or greater) or occlusion.

Skeleton: Reported separately.

Other neck: No masses.

Upper chest: No pneumothorax or nodule.

Review of the MIP images confirms the above findings

CTA HEAD FINDINGS

Anterior circulation: Calcification of the cavernous internal
carotid arteries consistent with cerebrovascular atherosclerotic
disease. No signs of intracranial large vessel occlusion.

Posterior circulation: No significant stenosis, proximal occlusion,
aneurysm, or vascular malformation.

Venous sinuses: As permitted by contrast timing, patent.

Anatomic variants: None of significance

Delayed phase: Not performed.

Review of the MIP images confirms the above findings
IMPRESSION: 1. High-grade stenosis of the proximal RIGHT ICA, estimated 75-90%,
potentially greater. Vascular surgical consultation is warranted,
given the reduced caliber of the cervical ICA.
2. Non stenotic atheromatous change LEFT carotid bifurcation.
3. No intracranial large vessel occlusion.
4. See additional significant findings noncontrast CT scan related
to subdural fluid collections and focal LEFT parietal subdural clot.
5.  Aortic Atherosclerosis ([id number]).

## 2021-01-09 ENCOUNTER — Other Ambulatory Visit: Payer: Self-pay

## 2021-01-09 DIAGNOSIS — I6521 Occlusion and stenosis of right carotid artery: Secondary | ICD-10-CM

## 2021-01-11 ENCOUNTER — Ambulatory Visit: Payer: No Typology Code available for payment source

## 2021-01-11 ENCOUNTER — Encounter (HOSPITAL_COMMUNITY): Payer: No Typology Code available for payment source

## 2021-03-22 ENCOUNTER — Other Ambulatory Visit: Payer: Self-pay

## 2021-03-22 ENCOUNTER — Ambulatory Visit (HOSPITAL_COMMUNITY)
Admission: RE | Admit: 2021-03-22 | Discharge: 2021-03-22 | Disposition: A | Discharge status: Home / Self Care | Payer: No Typology Code available for payment source | Source: Ambulatory Visit | Attending: Vascular Surgery | Admitting: Vascular Surgery

## 2021-03-22 ENCOUNTER — Ambulatory Visit (INDEPENDENT_AMBULATORY_CARE_PROVIDER_SITE_OTHER): Payer: No Typology Code available for payment source | Admitting: Physician Assistant

## 2021-03-22 VITALS — BP 88/41 | HR 90 | Temp 98.0°F | Resp 18 | Ht 67.0 in | Wt 197.4 lb

## 2021-03-22 DIAGNOSIS — I6521 Occlusion and stenosis of right carotid artery: Secondary | ICD-10-CM | POA: Insufficient documentation

## 2021-03-22 NOTE — Progress Notes (Signed)
Office Note     CC:  follow up Requesting Provider:  [facility]  HPI: Johnny [name] is a 71 y.o. (Jun 28, 1950) male who presents for follow up of carotid artery disease. He has had right CEA on 10/01/19 for asymptomatic high grade stenosis. At his last visit in September of 2021 he was doing well with no new neurological symptoms. He was maintained on Aspirin and Statin. His contralateral carotid has been followed and has been in the 1-39% range of stenosis.   Today he reports feeling tired all the time. He has his primary care through the [place]. He is unsure of the last time he had a visit with the [place]. Knows they made some changes to his blood pressure medications at his last visit. He denies any amaurosis fugax or other visual changes, slurred speech, facial drooping, unilateral upper or lower extremity weakness or numbness.   The pt is on a statin for cholesterol management.  The pt is on a daily aspirin.   Other AC:  none The pt is on ACE for hypertension.   The pt is diabetic.   Tobacco hx:  every day, pipe smoker  Past Medical History:  Diagnosis Date   Anxiety    Chronic kidney disease    Depression    Diabetes mellitus without complication (HCC)    Type 1   Hyperlipemia    Hypertension    Hypothyroidism    SDH (subdural hematoma) 07/26/2018   from fall   Sleep apnea     Past Surgical History:  Procedure Laterality Date   APPENDECTOMY     ENDARTERECTOMY Right 10/01/2019   Procedure: ENDARTERECTOMY CAROTID; PATCH ANGIOPLASTY;  Surgeon: Marty Heck, MD;  Location: [facility] OR;  Service: Vascular;  Laterality: Right;   EYE SURGERY     PATCH ANGIOPLASTY Right 10/01/2019   Procedure: PATCH ANGIOPLASTY USING [place];  Surgeon: Marty Heck, MD;  Location: [facility] OR;  Service: Vascular;  Laterality: Right;    Social History   Socioeconomic History   Marital status: Single    Spouse name: Not on file   Number of children: Not on file    Years of education: Not on file   Highest education level: Not on file  Occupational History   Not on file  Tobacco Use   Smoking status: Every Day    Types: Pipe   Smokeless tobacco: Never  Vaping Use   Vaping Use: Never used  Substance and Sexual Activity   Alcohol use: No   Drug use: No   Sexual activity: Not on file  Other Topics Concern   Not on file  Social History Narrative   Not on file   Social Determinants of Health   Financial Resource Strain: Not on file  Food Insecurity: Not on file  Transportation Needs: Not on file  Physical Activity: Not on file  Stress: Not on file  Social Connections: Not on file  Intimate Partner Violence: Not on file    Family History  Family history unknown: Yes    Current Outpatient Medications  Medication Sig Dispense Refill   acetaminophen (TYLENOL) 650 MG CR tablet Take 650 mg by mouth every 8 (eight) hours as needed for pain.      aspirin EC 81 MG tablet Take 81 mg by mouth daily.     atorvastatin (LIPITOR) 40 MG tablet Take 40 mg by mouth at bedtime.     calcium carbonate (OSCAL) 1500 (600 Ca) MG  TABS tablet Take 600 mg by mouth daily.      Cholecalciferol (VITAMIN D-3) 25 MCG (1000 UT) CAPS Take 1,000 Units by mouth daily with breakfast.     CINNAMON PO Take 1,000 mg by mouth daily.      diphenhydrAMINE HCl (ZZZQUIL) 50 MG/30ML LIQD Take 50 mg by mouth at bedtime as needed (sleep).      FLUoxetine (PROZAC) 10 MG tablet Take 30 mg by mouth daily.     glucose blood test strip 1 each by Other route as needed (blood glucose level). Use as instructed     insulin aspart (NOVOLOG) 100 UNIT/ML injection Inject 20 Units into the skin 3 (three) times daily with meals.      insulin glargine (LANTUS) 100 UNIT/ML injection Inject 50 Units into the skin at bedtime.      Insulin Syringe-Needle U-100 (INSULIN SYRINGE .5CC/30GX1/2") 30G X 1/2" 0.5 ML MISC Inject 1 Syringe into the skin 4 (four) times daily.     levothyroxine (SYNTHROID) 25  MCG tablet Take 25 mcg by mouth daily before breakfast.     lisinopril (ZESTRIL) 10 MG tablet Take 20 mg by mouth daily.     Multiple Vitamins-Minerals (ONE-A-DAY MENS 50+ ADVANTAGE) TABS Take 1 tablet by mouth daily with breakfast.     ondansetron (ZOFRAN ODT) 4 MG disintegrating tablet Take 1 tablet (4 mg total) by mouth every 8 (eight) hours as needed for nausea or vomiting. 20 tablet 0   oxyCODONE-acetaminophen (PERCOCET) 5-325 MG tablet Take 1 tablet by mouth every 6 (six) hours as needed. 8 tablet 0   tamsulosin (FLOMAX) 0.4 MG CAPS capsule Take 0.4 mg by mouth daily.     atenolol (TENORMIN) 25 MG tablet Take 1 tablet (25 mg total) by mouth 2 (two) times daily. (Patient not taking: Reported on 10/21/2019)     levETIRAcetam (KEPPRA) 500 MG tablet Take 1 tablet (500 mg total) by mouth 2 (two) times daily. (Patient not taking: Reported on 03/22/2021) 60 tablet 4   No current facility-administered medications for this visit.    No Known Allergies   REVIEW OF SYSTEMS:  [X]  denotes positive finding, [ ]  denotes negative finding Cardiac  Comments:  Chest pain or chest pressure:    Shortness of breath upon exertion: X   Short of breath when lying flat:    Irregular heart rhythm:        Vascular    Pain in calf, thigh, or hip brought on by ambulation:    Pain in feet at night that wakes you up from your sleep:     Blood clot in your veins:    Leg swelling:         Pulmonary    Oxygen at home:    Productive cough:     Wheezing:         Neurologic    Sudden weakness in arms or legs:     Sudden numbness in arms or legs:     Sudden onset of difficulty speaking or slurred speech:    Temporary loss of vision in one eye:     Problems with dizziness:         Gastrointestinal    Blood in stool:     Vomited blood:         Genitourinary    Burning when urinating:     Blood in urine:        Psychiatric    Major depression:  Hematologic    Bleeding problems:    Problems  with blood clotting too easily:        Skin    Rashes or ulcers:        Constitutional    Fever or chills:      PHYSICAL EXAMINATION:  Vitals:   03/22/21 1231 03/22/21 1233  BP: (!) 77/39 (!) 88/41  Pulse: 90   Resp: 18   Temp: 98 F (36.7 C)   TempSrc: Temporal   SpO2: (!) 86%   Weight: 197 lb 6.4 oz (89.5 kg)   Height: 5\' 7"  (1.702 m)     General:  WDWN in NAD; vital signs documented above Gait: Normal HENT: WNL, normocephalic Pulmonary: normal non-labored breathing , without wheezing Cardiac: regular HR, without  Murmurs without carotid bruit Abdomen: distended, soft Vascular Exam/Pulses:2+ radial pulses, 2+ DP pulses bilaterally. Feet warm and well perfused. Motor and sensory intact Extremities: without ischemic changes, without Gangrene , without cellulitis; without open wounds;  Musculoskeletal: no muscle wasting or atrophy  Neurologic: A&O X 3;  No focal weakness or paresthesias are detected Psychiatric:  The pt has Normal affect.   Non-Invasive Vascular Imaging:   Summary:  Right Carotid: Velocities in the right ICA are consistent with a 1-39% stenosis. The ECA appears >50% stenosed.   Left Carotid: Velocities in the left ICA are consistent with a 1-39% stenosis.   Vertebrals:  Left vertebral artery demonstrates high resistant flow. Right vertebral artery was not visualized.  Subclavians: Normal flow hemodynamics were seen in bilateral subclavian arteries   ASSESSMENT/PLAN:: 71 y.o. male here for follow up for carotid artery stenosis.He has had right CEA on 10/01/19 for asymptomatic high grade stenosis.He has no new neurological symptoms associated with is carotid disease. Duplex today shows bilateral 1-39% stenosis. Normal flow in the right and left vertebral arteries as well as subclavian arteries bilaterally. His blood pressure was very low at today's office visit. He took his blood pressure medication this morning. He says he has been feeling very tired all  the time. [facility] Advised him to make sure to discuss his blood pressure medications with them today - Continue Aspirin and statin - reviewed signs and symptoms of TIA/ Stroke and he understands should theses occur to seek immediate medical attention -He will follow up in 1 year with repeat Carotid Duplex   Karoline Caldwell, PA-C [facility] [phone]  Clinic MD:  Roxanne Mins

## 2021-10-12 ENCOUNTER — Other Ambulatory Visit: Payer: Self-pay | Admitting: Internal Medicine

## 2021-10-12 ENCOUNTER — Other Ambulatory Visit (HOSPITAL_COMMUNITY): Payer: Self-pay | Admitting: Internal Medicine

## 2021-10-12 DIAGNOSIS — N184 Chronic kidney disease, stage 4 (severe): Secondary | ICD-10-CM

## 2021-10-12 DIAGNOSIS — N400 Enlarged prostate without lower urinary tract symptoms: Secondary | ICD-10-CM

## 2021-10-21 ENCOUNTER — Ambulatory Visit (HOSPITAL_COMMUNITY)
Admission: RE | Admit: 2021-10-21 | Discharge: 2021-10-21 | Disposition: A | Discharge status: Home / Self Care | Payer: Medicare Other | Source: Ambulatory Visit | Attending: Internal Medicine | Admitting: Internal Medicine

## 2021-10-21 DIAGNOSIS — N184 Chronic kidney disease, stage 4 (severe): Secondary | ICD-10-CM | POA: Insufficient documentation

## 2021-10-21 DIAGNOSIS — N400 Enlarged prostate without lower urinary tract symptoms: Secondary | ICD-10-CM | POA: Insufficient documentation

## 2022-04-27 ENCOUNTER — Encounter (HOSPITAL_COMMUNITY): Payer: Self-pay

## 2022-04-27 ENCOUNTER — Other Ambulatory Visit: Payer: Self-pay

## 2022-04-27 ENCOUNTER — Emergency Department (HOSPITAL_COMMUNITY)
Admission: EM | Admit: 2022-04-27 | Discharge: 2022-04-28 | Disposition: A | Discharge status: Home / Self Care | Payer: No Typology Code available for payment source | Attending: Emergency Medicine | Admitting: Emergency Medicine

## 2022-04-27 DIAGNOSIS — Z794 Long term (current) use of insulin: Secondary | ICD-10-CM | POA: Insufficient documentation

## 2022-04-27 DIAGNOSIS — Z7982 Long term (current) use of aspirin: Secondary | ICD-10-CM | POA: Diagnosis not present

## 2022-04-27 DIAGNOSIS — Z1152 Encounter for screening for COVID-19: Secondary | ICD-10-CM | POA: Diagnosis not present

## 2022-04-27 DIAGNOSIS — F4321 Adjustment disorder with depressed mood: Secondary | ICD-10-CM | POA: Insufficient documentation

## 2022-04-27 DIAGNOSIS — R45851 Suicidal ideations: Secondary | ICD-10-CM

## 2022-04-27 DIAGNOSIS — T839XXD Unspecified complication of genitourinary prosthetic device, implant and graft, subsequent encounter: Secondary | ICD-10-CM

## 2022-04-27 DIAGNOSIS — Z79899 Other long term (current) drug therapy: Secondary | ICD-10-CM | POA: Diagnosis not present

## 2022-04-27 DIAGNOSIS — T83098D Other mechanical complication of other indwelling urethral catheter, subsequent encounter: Secondary | ICD-10-CM | POA: Insufficient documentation

## 2022-04-27 DIAGNOSIS — F4323 Adjustment disorder with mixed anxiety and depressed mood: Secondary | ICD-10-CM | POA: Insufficient documentation

## 2022-04-27 DIAGNOSIS — E119 Type 2 diabetes mellitus without complications: Secondary | ICD-10-CM | POA: Diagnosis not present

## 2022-04-27 LAB — CBC WITH DIFFERENTIAL/PLATELET
Abs Immature Granulocytes: 0.02 10*3/uL (ref 0.00–0.07)
Basophils Absolute: 0 10*3/uL (ref 0.0–0.1)
Basophils Relative: 0 %
Eosinophils Absolute: 0.2 10*3/uL (ref 0.0–0.5)
Eosinophils Relative: 2 %
HCT: 44 % (ref 39.0–52.0)
Hemoglobin: 14.8 g/dL (ref 13.0–17.0)
Immature Granulocytes: 0 %
Lymphocytes Relative: 20 %
Lymphs Abs: 2.2 10*3/uL (ref 0.7–4.0)
MCH: 31.5 pg (ref 26.0–34.0)
MCHC: 33.6 g/dL (ref 30.0–36.0)
MCV: 93.6 fL (ref 80.0–100.0)
Monocytes Absolute: 0.5 10*3/uL (ref 0.1–1.0)
Monocytes Relative: 5 %
Neutro Abs: 7.9 10*3/uL — ABNORMAL HIGH (ref 1.7–7.7)
Neutrophils Relative %: 73 %
Platelets: 218 10*3/uL (ref 150–400)
RBC: 4.7 MIL/uL (ref 4.22–5.81)
RDW: 13.1 % (ref 11.5–15.5)
WBC: 10.9 10*3/uL — ABNORMAL HIGH (ref 4.0–10.5)
nRBC: 0 % (ref 0.0–0.2)

## 2022-04-27 LAB — COMPREHENSIVE METABOLIC PANEL
ALT: 102 U/L — ABNORMAL HIGH (ref 0–44)
AST: 42 U/L — ABNORMAL HIGH (ref 15–41)
Albumin: 4.2 g/dL (ref 3.5–5.0)
Alkaline Phosphatase: 54 U/L (ref 38–126)
Anion gap: 9 (ref 5–15)
BUN: 20 mg/dL (ref 8–23)
CO2: 21 mmol/L — ABNORMAL LOW (ref 22–32)
Calcium: 9.6 mg/dL (ref 8.9–10.3)
Chloride: 113 mmol/L — ABNORMAL HIGH (ref 98–111)
Creatinine, Ser: 2.08 mg/dL — ABNORMAL HIGH (ref 0.61–1.24)
GFR, Estimated: 33 mL/min — ABNORMAL LOW (ref >60)
Glucose, Bld: 220 mg/dL — ABNORMAL HIGH (ref 70–99)
Potassium: 4.5 mmol/L (ref 3.5–5.1)
Sodium: 143 mmol/L (ref 135–145)
Total Bilirubin: 0.9 mg/dL (ref 0.3–1.2)
Total Protein: 7.1 g/dL (ref 6.5–8.1)

## 2022-04-27 LAB — RAPID URINE DRUG SCREEN, HOSP PERFORMED
Amphetamines: NOT DETECTED
Barbiturates: NOT DETECTED
Benzodiazepines: NOT DETECTED
Cocaine: NOT DETECTED
Opiates: NOT DETECTED
Tetrahydrocannabinol: NOT DETECTED

## 2022-04-27 LAB — ETHANOL: Alcohol, Ethyl (B): <10 mg/dL (ref <10)

## 2022-04-27 MED ORDER — INSULIN ASPART 100 UNIT/ML IJ SOLN
0.0000 [IU] | Freq: Three times a day (TID) | INTRAMUSCULAR | Status: DC
  Administered 2022-04-28: 11 [IU] via SUBCUTANEOUS

## 2022-04-27 MED ORDER — ACETAMINOPHEN 325 MG PO TABS
650.0000 mg | ORAL_TABLET | ORAL | Status: AC
  Administered 2022-04-27: 650 mg via ORAL
  Filled 2022-04-27: qty 2

## 2022-04-27 MED ORDER — LIDOCAINE HCL URETHRAL/MUCOSAL 2 % EX GEL
1.0000 | Freq: Once | CUTANEOUS | Status: AC
  Administered 2022-04-27: 1 via TOPICAL
  Filled 2022-04-27: qty 11

## 2022-04-27 NOTE — ED Notes (Signed)
All personal belongings were sent home with pt's wife

## 2022-04-27 NOTE — ED Notes (Signed)
Family keeping pts belongings.

## 2022-04-27 NOTE — ED Triage Notes (Signed)
Pt states he has pain in his penis from a indwelling urinary catheter that was placed yesterday. Pt states he would like to die and his plan is to take a lot of insulin to do it.

## 2022-04-27 NOTE — ED Notes (Signed)
Pt is not IVC for now VOLUNTARY CONSENT FORM ATTACHED TO THE CLIP BOARD IN [place]

## 2022-04-27 NOTE — ED Provider Triage Note (Signed)
Emergency Medicine Provider Triage Evaluation Note  Johnny [name] , a 72 y.o. male  was evaluated in triage.  Pt complains of pain after insertion of a urinary Foley catheter.  Patient was seen at a [place] facility yesterday where a catheter was placed due to urinary retention.  He states that catheter is extremely painful.  Patient also endorses suicidal ideations with thoughts of killing himself by insulin overdose, reportedly due to the pain.  He denies HI, hallucinations.  He states that the reason he feels suicidal is due to the Foley catheter pain.  Review of Systems  Positive: As above Negative: As above  Physical Exam  BP (!) 112/47   Pulse 88   Temp 98.5 F (36.9 C) (Oral)   Resp 15   SpO2 97%  Gen:   Awake, no distress   Resp:  Normal effort  MSK:   Moves extremities without difficulty  Other:    Medical Decision Making  Medically screening exam initiated at 2:33 PM.  Appropriate orders placed.  [name] was informed that the remainder of the evaluation will be completed by another provider, this initial triage assessment does not replace that evaluation, and the importance of remaining in the ED until their evaluation is complete.     Dorothyann Peng, PA-C 04/27/22 1434

## 2022-04-27 NOTE — ED Provider Notes (Signed)
[place] Provider Note   CSN: [id number] Arrival date & time: 04/27/22  1403     History  Chief Complaint  Patient presents with   Suicidal    Johnny [name] is a 72 y.o. male.  72 year old male with a history of depression and suicide attempts and insulin-dependent diabetes who presents emergency department with suicidal ideation.  Patient reports that recently has been having bouts of depression.  Says that he is having thoughts that may be better to be dead.  Says that he also is having thoughts of overdosing on his insulin.  Was seen at the [place] and had a Foley catheter that was placed yesterday for urinary retention.  Is complaining of pain around the tip of his penis after the catheter was placed.  No significant suprapubic pain.  No flank pain, fevers, nausea or vomiting.  Says that he feels that the pain may be related to his not wanting to live but was having these thoughts before the Foley catheter was placed.       Home Medications Prior to Admission medications   Medication Sig Start Date End Date Taking? Authorizing Provider  acetaminophen (TYLENOL) 650 MG CR tablet Take 650 mg by mouth every 8 (eight) hours as needed for pain.     [provider]  aspirin EC 81 MG tablet Take 81 mg by mouth daily.    [provider]  atenolol (TENORMIN) 25 MG tablet Take 1 tablet (25 mg total) by mouth 2 (two) times daily. Patient not taking: Reported on 10/21/2019 07/29/18   Rai, Vernelle Emerald, MD  atorvastatin (LIPITOR) 40 MG tablet Take 40 mg by mouth at bedtime.    [provider]  calcium carbonate (OSCAL) 1500 (600 Ca) MG TABS tablet Take 600 mg by mouth daily.     [provider]  Cholecalciferol (VITAMIN D-3) 25 MCG (1000 UT) CAPS Take 1,000 Units by mouth daily with breakfast.    [provider]  CINNAMON PO Take 1,000 mg by mouth daily.     [provider]  diphenhydrAMINE HCl  (ZZZQUIL) 50 MG/30ML LIQD Take 50 mg by mouth at bedtime as needed (sleep).     [provider]  FLUoxetine (PROZAC) 10 MG tablet Take 30 mg by mouth daily.    [provider]  glucose blood test strip 1 each by Other route as needed (blood glucose level). Use as instructed    [provider]  insulin aspart (NOVOLOG) 100 UNIT/ML injection Inject 20 Units into the skin 3 (three) times daily with meals.     [provider]  insulin glargine (LANTUS) 100 UNIT/ML injection Inject 50 Units into the skin at bedtime.     [provider]  Insulin Syringe-Needle U-100 (INSULIN SYRINGE .5CC/30GX1/2") 30G X 1/2" 0.5 ML MISC Inject 1 Syringe into the skin 4 (four) times daily.    [provider]  levETIRAcetam (KEPPRA) 500 MG tablet Take 1 tablet (500 mg total) by mouth 2 (two) times daily. Patient not taking: Reported on 03/22/2021 07/28/18   Rai, Vernelle Emerald, MD  levothyroxine (SYNTHROID) 25 MCG tablet Take 25 mcg by mouth daily before breakfast.    [provider]  lisinopril (ZESTRIL) 10 MG tablet Take 20 mg by mouth daily.    [provider]  Multiple Vitamins-Minerals (ONE-A-DAY MENS 50+ ADVANTAGE) TABS Take 1 tablet by mouth daily with breakfast.    [provider]  ondansetron (ZOFRAN ODT)  4 MG disintegrating tablet Take 1 tablet (4 mg total) by mouth every 8 (eight) hours as needed for nausea or vomiting. 07/28/18   Rai, Vernelle Emerald, MD  oxyCODONE-acetaminophen (PERCOCET) 5-325 MG tablet Take 1 tablet by mouth every 6 (six) hours as needed. 10/02/19   Rhyne, Hulen Shouts, PA-C  tamsulosin (FLOMAX) 0.4 MG CAPS capsule Take 0.4 mg by mouth daily.    [provider]      Allergies    Patient has no known allergies.    Review of Systems   Review of Systems  Physical Exam Updated Vital Signs BP 127/62 (BP Location: Right Arm)   Pulse 73   Temp 98.6 F (37 C) (Oral)   Resp 18   Ht 5\' 7"  (1.702 m)   Wt 89.5 kg    SpO2 96%   BMI 30.90 kg/m  Physical Exam Vitals and nursing note reviewed.  Constitutional:      General: He is not in acute distress.    Appearance: He is well-developed.  HENT:     Head: Normocephalic and atraumatic.     Right Ear: External ear normal.     Left Ear: External ear normal.     Nose: Nose normal.  Eyes:     Extraocular Movements: Extraocular movements intact.     Conjunctiva/sclera: Conjunctivae normal.     Pupils: Pupils are equal, round, and reactive to light.  Abdominal:     General: There is no distension.     Palpations: Abdomen is soft. There is no mass.     Tenderness: There is no abdominal tenderness. There is no guarding.  Genitourinary:    Comments: Foley bag in left leg with straw-colored urine Musculoskeletal:     Cervical back: Normal range of motion and neck supple.  Skin:    General: Skin is warm and dry.  Neurological:     Mental Status: He is alert. Mental status is at baseline.  Psychiatric:        Mood and Affect: Mood normal.        Behavior: Behavior normal.     ED Results / Procedures / Treatments   Labs (all labs ordered are listed, but only abnormal results are displayed) Labs Reviewed  COMPREHENSIVE METABOLIC PANEL - Abnormal; Notable for the following components:      Result Value   Chloride 113 (*)    CO2 21 (*)    Glucose, Bld 220 (*)    Creatinine, Ser 2.08 (*)    AST 42 (*)    ALT 102 (*)    GFR, Estimated 33 (*)    All other components within normal limits  CBC WITH DIFFERENTIAL/PLATELET - Abnormal; Notable for the following components:   WBC 10.9 (*)    Neutro Abs 7.9 (*)    All other components within normal limits  CBG MONITORING, ED - Abnormal; Notable for the following components:   Glucose-Capillary 104 (*)    All other components within normal limits  RESP PANEL BY RT-PCR (RSV, FLU A&B, COVID)  RVPGX2  ETHANOL  RAPID URINE DRUG SCREEN, HOSP PERFORMED    EKG None  Radiology No results  found.  Procedures Procedures   Medications Ordered in ED Medications  insulin aspart (novoLOG) injection 0-15 Units ( Subcutaneous Not Given 04/28/22 0718)  acetaminophen (TYLENOL) tablet 650 mg (650 mg Oral Given 04/28/22 0411)  melatonin tablet 3 mg (3 mg Oral Given 04/28/22 0411)  lidocaine (XYLOCAINE) 2 % jelly 1 Application (1 Application Topical  Given 04/27/22 1803)  acetaminophen (TYLENOL) tablet 650 mg (650 mg Oral Given 04/27/22 1803)    ED Course/ Medical Decision Making/ A&P Clinical Course as of 04/28/22 1033  Thu Apr 27, 2022  1750 Creatinine(!): 2.08 At baseline [RP]  2020 Patient reports improved pain after the Uro-Jet. [RP]    Clinical Course User Index [RP] Fransico Meadow, MD                            Medical Decision Making Amount and/or Complexity of Data Reviewed Labs:  Decision-making details documented in ED Course.  Risk OTC drugs. Prescription drug management.   Johnny [name] is a 72 y.o. male with comorbidities that complicate the patient evaluation including  depression and suicide attempts and insulin-dependent diabetes who presents emergency department with suicidal ideation  Initial Ddx:  Suicidal ideation, depression, Foley irritation, UTI  MDM:  The patient's depression and suicidal ideation will have him see psychiatry.  Was willing to remain voluntarily in the emergency department so do not feel that IVC is warranted at this time.  Does not some irritation from where his Foley catheter was placed we will give him Uro-Jet for comfort.  Says that this was present as soon as the Foley was placed so for the UTI less likely.  Plan:  Psych clearance labs Psych evaluation  ED Summary/Re-evaluation:  Patient was feeling much better with the Uro-Jet and Tylenol.  He was seen by TTS and we are awaiting their recommendations at this time.  Sliding scale insulin orders were placed as we are waiting for pharmacy to complete the patient's medication  reconciliation and will require his home medications to be ordered.  This patient presents to the ED for concern of complaints listed in HPI, this involves an extensive number of treatment options, and is a complaint that carries with it a high risk of complications and morbidity. Disposition including potential need for admission considered.   Dispo: Pending remainder of workup  Additional history obtained from family Records reviewed Outpatient Clinic Notes The following labs were independently interpreted: Chemistry and show CKD Consults: TTS Social Determinants of health:  Elderly  Final Clinical Impression(s) / ED Diagnoses Final diagnoses:  Suicidal ideation  Complication of Foley catheter, subsequent encounter    Rx / DC Orders ED Discharge Orders     None         Fransico Meadow, MD 04/28/22 [phone]

## 2022-04-28 DIAGNOSIS — F4323 Adjustment disorder with mixed anxiety and depressed mood: Secondary | ICD-10-CM | POA: Insufficient documentation

## 2022-04-28 DIAGNOSIS — R45851 Suicidal ideations: Secondary | ICD-10-CM

## 2022-04-28 LAB — CBG MONITORING, ED
Glucose-Capillary: 104 mg/dL — ABNORMAL HIGH (ref 70–99)
Glucose-Capillary: 325 mg/dL — ABNORMAL HIGH (ref 70–99)

## 2022-04-28 LAB — RESP PANEL BY RT-PCR (RSV, FLU A&B, COVID)  RVPGX2
Influenza A by PCR: NEGATIVE
Influenza B by PCR: NEGATIVE
Resp Syncytial Virus by PCR: NEGATIVE
SARS Coronavirus 2 by RT PCR: NEGATIVE

## 2022-04-28 MED ORDER — MELATONIN 3 MG PO TABS
3.0000 mg | ORAL_TABLET | Freq: Every day | ORAL | Status: DC
  Administered 2022-04-28: 3 mg via ORAL
  Filled 2022-04-28: qty 1

## 2022-04-28 MED ORDER — ACETAMINOPHEN 325 MG PO TABS
650.0000 mg | ORAL_TABLET | Freq: Four times a day (QID) | ORAL | Status: DC | PRN
  Administered 2022-04-28: 650 mg via ORAL
  Filled 2022-04-28: qty 2

## 2022-04-28 NOTE — ED Notes (Signed)
Placed call to pharmacy requesting medication review in order for MD to place appropriate orders.

## 2022-04-28 NOTE — ED Notes (Signed)
Pt's niece at bedside. Discussed DC papers with her.

## 2022-04-28 NOTE — ED Provider Notes (Signed)
Psychiatry has cleared patient for discharge. He is not suicidal when I'm talking to him. Will D/c.    Sherwood Gambler, MD 04/28/22 [phone]

## 2022-04-28 NOTE — ED Notes (Signed)
EDP at bedside  

## 2022-04-28 NOTE — Discharge Instructions (Addendum)
Discharge recommendations:   Medications: Patient is to take medications as prescribed. The patient or patient's guardian is to contact a medical professional and/or outpatient provider to address any new side effects that develop. The patient or the patient's guardian should update outpatient providers of any new medications and/or medication changes.   Outpatient Follow up: Please follow up with the [facility] for outpatient psychiatry for medication management and therapy. Please follow up with your primary care provider for all medical related needs.   [facility]  Address: [address] [place], [place] [address] Phone: [phone] Therapy: We recommend that patient participate in individual therapy to address mental health concerns.  Safety:   The following safety precautions should be taken:   No sharp objects. This includes scissors, razors, scrapers, and putty knives.   Chemicals should be removed and locked up.   Medications should be removed and locked up.   Weapons should be removed and locked up. This includes firearms, knives and instruments that can be used to cause injury.   The patient should abstain from use of illicit substances/drugs and abuse of any medications.  If symptoms worsen or do not continue to improve or if the patient becomes actively suicidal or homicidal then it is recommended that the patient return to the closest hospital emergency department, the [facility], or call 911 for further evaluation and treatment. National Suicide Prevention Lifeline 1-800-SUICIDE or (317) 521-8510.  About 988 988 offers 24/7 access to trained crisis counselors who can help people experiencing mental health-related distress. People can call or text 988 or chat 988lifeline.org for themselves or if they are worried about a loved one who may need crisis support.
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
## 2022-04-28 NOTE — Consult Note (Signed)
[name] Consult   Reason for Consult: Psych consult   Referring Physician: Ronny Bacon   Patient Identification: Johnny [name] MRN:  [id number] Principal Diagnosis: Suicidal ideations Diagnosis:  Principal Problem:   Suicidal ideations Active Problems:   Adjustment disorder with depressed mood   Total Time spent with patient: 30 minutes  Subjective:   Johnny [name] is a 72 y.o. male patient admitted to the [facility] emergency department with complaints of pain in his penis from a indwelling urinary catheter and suicidal ideations with a plan to OD on insulin.  HPI:  Johnny [name] is a 72 year old male patient with a past psychiatric history significant for MDD and GAD. Patient seen and evaluated face-to-face by this provider, chart reviewed and case and disposition discussed with Dr. Dwyane Dee. On evaluation, patient is alert and oriented x 4. His thought process is linear and speech is clear and coherent. His mood is euthymic and affect is congruent. He has fair eye contact. He is calm and cooperative and does not appear to be in acute distress at this time. Today, patient denies active suicidal ideations. He states that he does not want to kill himself but would rather not be here if he has to be in pain. He states that so far his pain has been manage by taking the Tylenol. He states that he was also provided with lidocaine for pain management. He expresses that yesterday he was having thoughts of wanting to escape the pain in his penis from a catheter insertion yesterday. He repeatedly states that he does not want to die. He verbally contracts for safety and verbalizes feeling safe to return home. He states that he would not want to cause significant pain to his ex-wife who he still has love for him. He identifies his ex-wife and his niece Soyla Murphy as his support persons. He reports 1 past suicide attempt by overdose 5 years ago. He identifies purpose for living  as his love for his ex wife, sense of responsibility to his family and love for his doggies. He states that he really enjoys reading and classical music. He denies depressive symptoms at this time. He reports fair sleep. He reports a fair appetite. He denies using illicit drugs or drinking alcohol. He resides with his niece, her husband, and her father. He denies access to firearms in the home and states that the guns in the home are locked away and secured. He states that he has a follow-up appointment with the [facility] on April 2 for medication management. He reports that he is currently prescribed fluoxetine. He denies outpatient therapy. I discussed with the patient at length the importance of getting established with outpatient therapy at the [facility] to address recent physical changes which may be attributing to his depressive symptoms. Patient verbalizes understanding and agrees to the stated plan. He gives verbal consent for this provider to speak with this niece Claud Kelp [phone].   I spoke to [facility] via phone to safety plan prior to patient's discharge. Soyla Murphy denies any safety concerns with the patient returning home today. She confirms that the patient does not have access to firearms in her home and that the firearms are locked up and secured.  She is agreeable to managing the patient's medications, including his insulin at home. She was informed that due to the patient suicidality as safety precautions the patient should not have access to firearms, sharp objects, medications, or hazardous chemicals in the home.  She verbalizes understanding. She was also informed that the patient is recommended to follow-up with the [facility] to establish therapy. She states that the patient has an upcoming appointment with the [facility] next week and she will make sure that he gets therapy.  Past Psychiatric History: History of MDD and GAD. Patient reports currently prescribed fluoxetine.  Risk to Self:   Patient denies active SI  Risk to Others:  No  Prior Inpatient Therapy:  Yes  Prior Outpatient Therapy:  No   Past Medical History:  Past Medical History:  Diagnosis Date   Anxiety    Chronic kidney disease    Depression    Diabetes mellitus without complication ([place])    Type 1   Hyperlipemia    Hypertension    Hypothyroidism    SDH (subdural hematoma) ([place]) 07/26/2018   from fall   Sleep apnea     Past Surgical History:  Procedure Laterality Date   APPENDECTOMY     ENDARTERECTOMY Right 10/01/2019   Procedure: ENDARTERECTOMY CAROTID; PATCH ANGIOPLASTY;  Surgeon: Marty Heck, MD;  Location: [facility];  Service: Vascular;  Laterality: Right;   EYE SURGERY     PATCH ANGIOPLASTY Right 10/01/2019   Procedure: PATCH ANGIOPLASTY USING [place];  Surgeon: Marty Heck, MD;  Location: [facility];  Service: Vascular;  Laterality: Right;   Family History:  Family History  Family history unknown: Yes   Family Psychiatric  History: No history reported.  Social History:  Social History   Substance and Sexual Activity  Alcohol Use No     Social History   Substance and Sexual Activity  Drug Use No    Social History   Socioeconomic History   Marital status: Single    Spouse name: Not on file   Number of children: Not on file   Years of education: Not on file   Highest education level: Not on file  Occupational History   Not on file  Tobacco Use   Smoking status: Every Day    Types: Pipe   Smokeless tobacco: Never  Vaping Use   Vaping Use: Never used  Substance and Sexual Activity   Alcohol use: No   Drug use: No   Sexual activity: Not on file  Other Topics Concern   Not on file  Social History Narrative   Not on file   Social Determinants of Health   Financial Resource Strain: Not on file  Food Insecurity: Not on file  Transportation Needs: Not on file  Physical Activity: Not on file  Stress: Not on file  Social Connections:  Not on file   Additional Social History:    Allergies:  No Known Allergies  Labs:  Results for orders placed or performed during the hospital encounter of 04/27/22 (from the past 48 hour(s))  Comprehensive metabolic panel     Status: Abnormal   Collection Time: 04/27/22  3:06 PM  Result Value Ref Range   Sodium 143 135 - 145 mmol/L   Potassium 4.5 3.5 - 5.1 mmol/L   Chloride 113 (H) 98 - 111 mmol/L   CO2 21 (L) 22 - 32 mmol/L   Glucose, Bld 220 (H) 70 - 99 mg/dL    Comment: Glucose reference range applies only to samples taken after fasting for at least 8 hours.   BUN 20 8 - 23 mg/dL   Creatinine, Ser 2.08 (H) 0.61 - 1.24 mg/dL   Calcium 9.6 8.9 - 10.3 mg/dL   Total  Protein 7.1 6.5 - 8.1 g/dL   Albumin 4.2 3.5 - 5.0 g/dL   AST 42 (H) 15 - 41 U/L   ALT 102 (H) 0 - 44 U/L   Alkaline Phosphatase 54 38 - 126 U/L   Total Bilirubin 0.9 0.3 - 1.2 mg/dL   GFR, Estimated 33 (L) >60 mL/min    Comment: (NOTE) Calculated using the CKD-EPI Creatinine Equation (2021)    Anion gap 9 5 - 15    Comment: Performed at [facility] [address] [place], [place] [address]  Ethanol     Status: None   Collection Time: 04/27/22  3:06 PM  Result Value Ref Range   Alcohol, Ethyl (B) <10 <10 mg/dL    Comment: (NOTE) Lowest detectable limit for serum alcohol is 10 mg/dL.  For medical purposes only. Performed at [facility], [place] [address] [place], [place] [address]   CBC with Diff     Status: Abnormal   Collection Time: 04/27/22  3:06 PM  Result Value Ref Range   WBC 10.9 (H) 4.0 - 10.5 K/uL   RBC 4.70 4.22 - 5.81 MIL/uL   Hemoglobin 14.8 13.0 - 17.0 g/dL   HCT 44.0 39.0 - 52.0 %   MCV 93.6 80.0 - 100.0 fL   MCH 31.5 26.0 - 34.0 pg   MCHC 33.6 30.0 - 36.0 g/dL   RDW 13.1 11.5 - 15.5 %   Platelets 218 150 - 400 K/uL   nRBC 0.0 0.0 - 0.2 %   Neutrophils Relative % 73 %   Neutro Abs 7.9 (H) 1.7 - 7.7 K/uL   Lymphocytes Relative 20 %   Lymphs Abs 2.2 0.7 - 4.0 K/uL    Monocytes Relative 5 %   Monocytes Absolute 0.5 0.1 - 1.0 K/uL   Eosinophils Relative 2 %   Eosinophils Absolute 0.2 0.0 - 0.5 K/uL   Basophils Relative 0 %   Basophils Absolute 0.0 0.0 - 0.1 K/uL   Immature Granulocytes 0 %   Abs Immature Granulocytes 0.02 0.00 - 0.07 K/uL    Comment: Performed at [facility] [address], [place], [place] [address]  Urine rapid drug screen (hosp performed)     Status: None   Collection Time: 04/27/22  5:03 PM  Result Value Ref Range   Opiates NONE DETECTED NONE DETECTED   Cocaine NONE DETECTED NONE DETECTED   Benzodiazepines NONE DETECTED NONE DETECTED   Amphetamines NONE DETECTED NONE DETECTED   Tetrahydrocannabinol NONE DETECTED NONE DETECTED   Barbiturates NONE DETECTED NONE DETECTED    Comment: (NOTE) DRUG SCREEN FOR MEDICAL PURPOSES ONLY.  IF CONFIRMATION IS NEEDED FOR ANY PURPOSE, NOTIFY LAB WITHIN 5 DAYS.  LOWEST DETECTABLE LIMITS FOR URINE DRUG SCREEN Drug Class                     Cutoff (ng/mL) Amphetamine and metabolites    1000 Barbiturate and metabolites    200 Benzodiazepine                 200 Opiates and metabolites        300 Cocaine and metabolites        300 THC                            50 Performed at [facility], [place] [address] [place], [place] [address]   Resp panel by RT-PCR (RSV,  Flu A&B, Covid) Anterior Nasal Swab     Status: None   Collection Time: 04/27/22 10:19 PM   Specimen: Anterior Nasal Swab  Result Value Ref Range   SARS Coronavirus 2 by RT PCR NEGATIVE NEGATIVE   Influenza A by PCR NEGATIVE NEGATIVE   Influenza B by PCR NEGATIVE NEGATIVE    Comment: (NOTE) The Xpert Xpress SARS-CoV-2/FLU/RSV plus assay is intended as an aid in the diagnosis of influenza from Nasopharyngeal swab specimens and should not be used as a sole basis for treatment. Nasal washings and aspirates are unacceptable for Xpert Xpress SARS-CoV-2/FLU/RSV testing.  Fact Sheet for  Patients: EntrepreneurPulse.com.au  Fact Sheet for Healthcare Providers: IncredibleEmployment.be  This test is not yet approved or cleared by the [place] FDA and has been authorized for detection and/or diagnosis of SARS-CoV-2 by FDA under an Emergency Use Authorization (EUA). This EUA will remain in effect (meaning this test can be used) for the duration of the COVID-19 declaration under Section 564(b)(1) of the Act, 21 U.S.C. section 360bbb-3(b)(1), unless the authorization is terminated or revoked.     Resp Syncytial Virus by PCR NEGATIVE NEGATIVE    Comment: (NOTE) Fact Sheet for Patients: EntrepreneurPulse.com.au  Fact Sheet for Healthcare Providers: IncredibleEmployment.be  This test is not yet approved or cleared by the [place] FDA and has been authorized for detection and/or diagnosis of SARS-CoV-2 by FDA under an Emergency Use Authorization (EUA). This EUA will remain in effect (meaning this test can be used) for the duration of the COVID-19 declaration under Section 564(b)(1) of the Act, 21 U.S.C. section 360bbb-3(b)(1), unless the authorization is terminated or revoked.  Performed at [facility], [place] [address], [place], [place] [address]   CBG monitoring, ED     Status: Abnormal   Collection Time: 04/28/22  7:18 AM  Result Value Ref Range   Glucose-Capillary 104 (H) 70 - 99 mg/dL    Comment: Glucose reference range applies only to samples taken after fasting for at least 8 hours.    Current Facility-Administered Medications  Medication Dose Route Frequency Provider Last Rate Last Admin   acetaminophen (TYLENOL) tablet 650 mg  650 mg Oral Q6H PRN Fatima Blank, MD   650 mg at 04/28/22 0411   insulin aspart (novoLOG) injection 0-15 Units  0-15 Units Subcutaneous TID WC Fransico Meadow, MD       melatonin tablet 3 mg  3 mg Oral QHS Cardama, Grayce Sessions, MD   3  mg at 04/28/22 0411   Current Outpatient Medications  Medication Sig Dispense Refill   acetaminophen (TYLENOL) 650 MG CR tablet Take 650 mg by mouth every 8 (eight) hours as needed for pain.      aspirin EC 81 MG tablet Take 81 mg by mouth daily.     atenolol (TENORMIN) 25 MG tablet Take 1 tablet (25 mg total) by mouth 2 (two) times daily. (Patient not taking: Reported on 10/21/2019)     atorvastatin (LIPITOR) 40 MG tablet Take 40 mg by mouth at bedtime.     calcium carbonate (OSCAL) 1500 (600 Ca) MG TABS tablet Take 600 mg by mouth daily.      Cholecalciferol (VITAMIN D-3) 25 MCG (1000 UT) CAPS Take 1,000 Units by mouth daily with breakfast.     CINNAMON PO Take 1,000 mg by mouth daily.      diphenhydrAMINE HCl (ZZZQUIL) 50 MG/30ML LIQD Take 50 mg by mouth at bedtime as needed (sleep).  FLUoxetine (PROZAC) 10 MG tablet Take 30 mg by mouth daily.     glucose blood test strip 1 each by Other route as needed (blood glucose level). Use as instructed     insulin aspart (NOVOLOG) 100 UNIT/ML injection Inject 20 Units into the skin 3 (three) times daily with meals.      insulin glargine (LANTUS) 100 UNIT/ML injection Inject 50 Units into the skin at bedtime.      Insulin Syringe-Needle U-100 (INSULIN SYRINGE .5CC/30GX1/2") 30G X 1/2" 0.5 ML MISC Inject 1 Syringe into the skin 4 (four) times daily.     levETIRAcetam (KEPPRA) 500 MG tablet Take 1 tablet (500 mg total) by mouth 2 (two) times daily. (Patient not taking: Reported on 03/22/2021) 60 tablet 4   levothyroxine (SYNTHROID) 25 MCG tablet Take 25 mcg by mouth daily before breakfast.     lisinopril (ZESTRIL) 10 MG tablet Take 20 mg by mouth daily.     Multiple Vitamins-Minerals (ONE-A-DAY MENS 50+ ADVANTAGE) TABS Take 1 tablet by mouth daily with breakfast.     ondansetron (ZOFRAN ODT) 4 MG disintegrating tablet Take 1 tablet (4 mg total) by mouth every 8 (eight) hours as needed for nausea or vomiting. 20 tablet 0   oxyCODONE-acetaminophen  (PERCOCET) 5-325 MG tablet Take 1 tablet by mouth every 6 (six) hours as needed. 8 tablet 0   tamsulosin (FLOMAX) 0.4 MG CAPS capsule Take 0.4 mg by mouth daily.      Musculoskeletal: Strength & Muscle Tone: within normal limits Gait & Station: normal Patient leans: N/A   Psychiatric Specialty Exam:  Presentation  General Appearance:  Appropriate for Environment  Eye Contact: Fair  Speech: Clear and Coherent  Speech Volume: Normal  Handedness:No data recorded  Mood and Affect  Mood: Euthymic  Affect: Congruent   Thought Process  Thought Processes: Coherent  Descriptions of Associations:Intact  Orientation:Full (Time, Place and Person)  Thought Content:Logical  History of Schizophrenia/Schizoaffective disorder:No  Duration of Psychotic Symptoms:No data recorded Hallucinations:Hallucinations: None  Ideas of Reference:None  Suicidal Thoughts:Suicidal Thoughts: No  Homicidal Thoughts:Homicidal Thoughts: No   Sensorium  Memory: Immediate Fair; Recent Fair; Remote Fair  Judgment: Fair  Insight: Fair   Community education officer  Concentration: Fair  Attention Span: Fair  Recall: [employer] of Knowledge: Fair  Language: Fair   Psychomotor Activity  Psychomotor Activity: Psychomotor Activity: Normal   Assets  Assets: Communication Skills; Desire for Improvement; Financial Resources/Insurance; Housing; Social Support   Sleep  Sleep: Sleep: Fair   Physical Exam: Physical Exam Cardiovascular:     Rate and Rhythm: Normal rate.  Pulmonary:     Effort: Pulmonary effort is normal.  Neurological:     Mental Status: He is alert and oriented to person, place, and time.    Review of Systems  Constitutional: Negative.   HENT: Negative.    Eyes: Negative.   Respiratory: Negative.    Cardiovascular: Negative.   Gastrointestinal: Negative.   Neurological: Negative.   Psychiatric/Behavioral:  Negative for depression,  hallucinations, substance abuse and suicidal ideas. The patient does not have insomnia.    Blood pressure 127/62, pulse 73, temperature 98.6 F (37 C), temperature source Oral, resp. rate 18, height 5\' 7"  (1.702 m), weight 89.5 kg, SpO2 96 %. Body mass index is 30.9 kg/m.  Treatment Plan Summary:  Although this patient presented  with SI with a plan he does not appear to be at imminent risk of dangerousness to self and dangerousness to others at this time. Patient  appears to have passive suicidal ideations in the context of pain from a recent foley catheter insertion. While future psychiatric events cannot be accurately predicted, the patient does not necessitate nor desire further acute inpatient psychiatric care at this time. This patient presents with risk factors past suicide attempt. These are mitigated by protective factors which include lack of active SI/HI, no history of violence, sense of responsibility to family and social supports, presence of an available support system, enjoyment of leisure actvities, expresses purpose for living, and effective problem solving skills.  Discharge recommendations:   Medications: Patient is to take medications as prescribed. The patient or patient's guardian is to contact a medical professional and/or outpatient provider to address any new side effects that develop. The patient or the patient's guardian should update outpatient providers of any new medications and/or medication changes.   Outpatient Follow up: Please follow up with the [facility] for outpatient psychiatry for medication management and therapy. Please follow up with your primary care provider for all medical related needs.   [facility]  Address: [address] [place], [place] [address] Phone: [phone]  Therapy: We recommend that patient participate in individual therapy to address mental health concerns.  Safety:   The following safety precautions  should be taken:   No sharp objects. This includes scissors, razors, scrapers, and putty knives.   Chemicals should be removed and locked up.   Medications should be removed and locked up.   Weapons should be removed and locked up. This includes firearms, knives and instruments that can be used to cause injury.   The patient should abstain from use of illicit substances/drugs and abuse of any medications.  If symptoms worsen or do not continue to improve or if the patient becomes actively suicidal or homicidal then it is recommended that the patient return to the closest hospital emergency department, the [facility], or call 911 for further evaluation and treatment. National Suicide Prevention Lifeline 1-800-SUICIDE or 772-828-5709.  About 988 988 offers 24/7 access to trained crisis counselors who can help people experiencing mental health-related distress. People can call or text 988 or chat 988lifeline.org for themselves or if they are worried about a loved one who may need crisis support.    Disposition: No evidence of imminent risk to self or others at present.   Patient does not meet criteria for psychiatric inpatient admission. Supportive therapy provided about ongoing stressors. Discussed crisis plan, support from social network, calling 911, coming to the Emergency Department, and calling Suicide Hotline.  Marissa Calamity, NP 04/28/2022 11:55 AM

## 2022-04-28 NOTE — ED Notes (Signed)
Called pt's niece and she is on the way w/ clothing for pt

## 2022-04-30 ENCOUNTER — Other Ambulatory Visit: Payer: Self-pay

## 2022-04-30 ENCOUNTER — Emergency Department (HOSPITAL_COMMUNITY)
Admission: EM | Admit: 2022-04-30 | Discharge: 2022-05-04 | Disposition: A | Discharge status: Home / Self Care | Payer: Medicare Other | Attending: Emergency Medicine | Admitting: Emergency Medicine

## 2022-04-30 ENCOUNTER — Encounter (HOSPITAL_COMMUNITY): Payer: Self-pay

## 2022-04-30 DIAGNOSIS — F329 Major depressive disorder, single episode, unspecified: Secondary | ICD-10-CM | POA: Diagnosis not present

## 2022-04-30 DIAGNOSIS — N189 Chronic kidney disease, unspecified: Secondary | ICD-10-CM | POA: Diagnosis not present

## 2022-04-30 DIAGNOSIS — R45851 Suicidal ideations: Secondary | ICD-10-CM | POA: Diagnosis not present

## 2022-04-30 DIAGNOSIS — T8384XA Pain from genitourinary prosthetic devices, implants and grafts, initial encounter: Secondary | ICD-10-CM | POA: Diagnosis not present

## 2022-04-30 DIAGNOSIS — Z1152 Encounter for screening for COVID-19: Secondary | ICD-10-CM | POA: Insufficient documentation

## 2022-04-30 DIAGNOSIS — R944 Abnormal results of kidney function studies: Secondary | ICD-10-CM | POA: Insufficient documentation

## 2022-04-30 DIAGNOSIS — A419 Sepsis, unspecified organism: Secondary | ICD-10-CM | POA: Diagnosis not present

## 2022-04-30 DIAGNOSIS — N401 Enlarged prostate with lower urinary tract symptoms: Secondary | ICD-10-CM | POA: Diagnosis not present

## 2022-04-30 DIAGNOSIS — I129 Hypertensive chronic kidney disease with stage 1 through stage 4 chronic kidney disease, or unspecified chronic kidney disease: Secondary | ICD-10-CM | POA: Insufficient documentation

## 2022-04-30 DIAGNOSIS — D72829 Elevated white blood cell count, unspecified: Secondary | ICD-10-CM | POA: Insufficient documentation

## 2022-04-30 DIAGNOSIS — F4323 Adjustment disorder with mixed anxiety and depressed mood: Secondary | ICD-10-CM | POA: Diagnosis present

## 2022-04-30 DIAGNOSIS — E1122 Type 2 diabetes mellitus with diabetic chronic kidney disease: Secondary | ICD-10-CM | POA: Diagnosis not present

## 2022-04-30 DIAGNOSIS — R338 Other retention of urine: Secondary | ICD-10-CM | POA: Diagnosis not present

## 2022-04-30 DIAGNOSIS — Z79899 Other long term (current) drug therapy: Secondary | ICD-10-CM | POA: Diagnosis not present

## 2022-04-30 DIAGNOSIS — E039 Hypothyroidism, unspecified: Secondary | ICD-10-CM | POA: Insufficient documentation

## 2022-04-30 DIAGNOSIS — N4889 Other specified disorders of penis: Secondary | ICD-10-CM | POA: Diagnosis not present

## 2022-04-30 DIAGNOSIS — Z7982 Long term (current) use of aspirin: Secondary | ICD-10-CM | POA: Diagnosis not present

## 2022-04-30 DIAGNOSIS — T839XXD Unspecified complication of genitourinary prosthetic device, implant and graft, subsequent encounter: Secondary | ICD-10-CM

## 2022-04-30 DIAGNOSIS — I1310 Hypertensive heart and chronic kidney disease without heart failure, with stage 1 through stage 4 chronic kidney disease, or unspecified chronic kidney disease: Secondary | ICD-10-CM | POA: Diagnosis not present

## 2022-04-30 DIAGNOSIS — Z794 Long term (current) use of insulin: Secondary | ICD-10-CM | POA: Insufficient documentation

## 2022-04-30 DIAGNOSIS — T83091A Other mechanical complication of indwelling urethral catheter, initial encounter: Secondary | ICD-10-CM | POA: Insufficient documentation

## 2022-04-30 DIAGNOSIS — N3001 Acute cystitis with hematuria: Secondary | ICD-10-CM

## 2022-04-30 DIAGNOSIS — Y828 Other medical devices associated with adverse incidents: Secondary | ICD-10-CM | POA: Insufficient documentation

## 2022-04-30 LAB — BASIC METABOLIC PANEL
Anion gap: 9 (ref 5–15)
BUN: 34 mg/dL — ABNORMAL HIGH (ref 8–23)
CO2: 21 mmol/L — ABNORMAL LOW (ref 22–32)
Calcium: 9.7 mg/dL (ref 8.9–10.3)
Chloride: 111 mmol/L (ref 98–111)
Creatinine, Ser: 2.38 mg/dL — ABNORMAL HIGH (ref 0.61–1.24)
GFR, Estimated: 28 mL/min — ABNORMAL LOW (ref >60)
Glucose, Bld: 158 mg/dL — ABNORMAL HIGH (ref 70–99)
Potassium: 3.8 mmol/L (ref 3.5–5.1)
Sodium: 141 mmol/L (ref 135–145)

## 2022-04-30 LAB — CBC WITH DIFFERENTIAL/PLATELET
Abs Immature Granulocytes: 0.04 10*3/uL (ref 0.00–0.07)
Basophils Absolute: 0 10*3/uL (ref 0.0–0.1)
Basophils Relative: 0 %
Eosinophils Absolute: 0.3 10*3/uL (ref 0.0–0.5)
Eosinophils Relative: 3 %
HCT: 43.5 % (ref 39.0–52.0)
Hemoglobin: 14.6 g/dL (ref 13.0–17.0)
Immature Granulocytes: 0 %
Lymphocytes Relative: 18 %
Lymphs Abs: 2.1 10*3/uL (ref 0.7–4.0)
MCH: 31.5 pg (ref 26.0–34.0)
MCHC: 33.6 g/dL (ref 30.0–36.0)
MCV: 94 fL (ref 80.0–100.0)
Monocytes Absolute: 0.7 10*3/uL (ref 0.1–1.0)
Monocytes Relative: 6 %
Neutro Abs: 8 10*3/uL — ABNORMAL HIGH (ref 1.7–7.7)
Neutrophils Relative %: 73 %
Platelets: 202 10*3/uL (ref 150–400)
RBC: 4.63 MIL/uL (ref 4.22–5.81)
RDW: 12.9 % (ref 11.5–15.5)
WBC: 11.2 10*3/uL — ABNORMAL HIGH (ref 4.0–10.5)
nRBC: 0 % (ref 0.0–0.2)

## 2022-04-30 MED ORDER — SODIUM CHLORIDE 0.9 % IV BOLUS
1000.0000 mL | Freq: Once | INTRAVENOUS | Status: AC
  Administered 2022-04-30: 1000 mL via INTRAVENOUS

## 2022-04-30 NOTE — ED Provider Notes (Incomplete)
[place] EMERGENCY DEPARTMENT AT [facility] Provider Note   CSN: [id number] Arrival date & time: 04/30/22  2057     History  Chief Complaint  Patient presents with  . Hypotension  . Dysuria    Johnny [name] is a 72 y.o. male with medical history of chronic kidney disease, depression, diabetes, BPH, hypertension, hypothyroid.  Patient presents to the ED for evaluation of dysuria, hypertension.  Patient reports that he had a catheter placed on Wednesday at the [place].  Patient states that he had a catheter secondary to urinary retention as a result of BPH.  Patient reports that ever since having the catheter placed he has had exquisite pain around tip of his penis.  Patient states that he had immediate pain upon catheter insertion on Wednesday, his pain has remained the same since Wednesday.  Patient denies any new features to the pain.  Patient rates his pain at a 4 out of 10.  Patient states pain is worse with movement, better with rest.  Patient denies fevers, nausea, vomiting, abdominal pain, flank pain.  Patient reports that whenever he urinates he will have diarrhea however goes on to state that this is occurred for the last 1 year.  Patient niece at bedside reports patient has not been eating or drinking ever since having catheter placed.  Patient reports that he feels as if he is a burden to his niece, states he lives upstairs and his niece asked to come up and down the stairs to bring him food.  Patient goes on to state that sometimes he wishes he would just "wither away".  Patient states that he is not suicidal however reports he does have excessive medical issues and feels as if everyone's life would be better off without him.  Patient was seen for the same issue on 3/21 and ultimately discharged after TTS consult.  Patient reports that he would go home and overdose on insulin.  Patient denies access to firearms.  Patient reports he does see psychiatry at [facility], urology at the  [facility].  Of note, the patient reports that he took his morning and nighttime blood pressure medication doses this evening.  Patient reports that he forgot to take his blood pressure medication this morning and was trying to "catch up".  Patient denies that this was a suicide attempt.   Dysuria Presenting symptoms: dysuria        Home Medications Prior to Admission medications   Medication Sig Start Date End Date Taking? Authorizing Provider  acetaminophen (TYLENOL) 650 MG CR tablet Take 650 mg by mouth every 8 (eight) hours as needed for pain.     [provider]  aspirin EC 81 MG tablet Take 81 mg by mouth daily.    [provider]  atenolol (TENORMIN) 25 MG tablet Take 1 tablet (25 mg total) by mouth 2 (two) times daily. Patient not taking: Reported on 10/21/2019 07/29/18   Rai, Vernelle Emerald, MD  atorvastatin (LIPITOR) 40 MG tablet Take 40 mg by mouth at bedtime.    [provider]  calcium carbonate (OSCAL) 1500 (600 Ca) MG TABS tablet Take 600 mg by mouth daily.     [provider]  Cholecalciferol (VITAMIN D-3) 25 MCG (1000 UT) CAPS Take 1,000 Units by mouth daily with breakfast.    [provider]  CINNAMON PO Take 1,000 mg by mouth daily.     [provider]  diphenhydrAMINE HCl (ZZZQUIL) 50 MG/30ML LIQD Take 50 mg by  mouth at bedtime as needed (sleep).     [provider]  FLUoxetine (PROZAC) 10 MG tablet Take 30 mg by mouth daily.    [provider]  glucose blood test strip 1 each by Other route as needed (blood glucose level). Use as instructed    [provider]  insulin aspart (NOVOLOG) 100 UNIT/ML injection Inject 20 Units into the skin 3 (three) times daily with meals.     [provider]  insulin glargine (LANTUS) 100 UNIT/ML injection Inject 50 Units into the skin at bedtime.     [provider]  Insulin Syringe-Needle U-100 (INSULIN SYRINGE .5CC/30GX1/2") 30G X 1/2" 0.5 ML MISC  Inject 1 Syringe into the skin 4 (four) times daily.    [provider]  levETIRAcetam (KEPPRA) 500 MG tablet Take 1 tablet (500 mg total) by mouth 2 (two) times daily. Patient not taking: Reported on 03/22/2021 07/28/18   Rai, Vernelle Emerald, MD  levothyroxine (SYNTHROID) 25 MCG tablet Take 25 mcg by mouth daily before breakfast.    [provider]  lisinopril (ZESTRIL) 10 MG tablet Take 20 mg by mouth daily.    [provider]  Multiple Vitamins-Minerals (ONE-A-DAY MENS 50+ ADVANTAGE) TABS Take 1 tablet by mouth daily with breakfast.    [provider]  ondansetron (ZOFRAN ODT) 4 MG disintegrating tablet Take 1 tablet (4 mg total) by mouth every 8 (eight) hours as needed for nausea or vomiting. 07/28/18   Rai, Ripudeep K, MD  oxyCODONE-acetaminophen (PERCOCET) 5-325 MG tablet Take 1 tablet by mouth every 6 (six) hours as needed. 10/02/19   Rhyne, Hulen Shouts, PA-C  tamsulosin (FLOMAX) 0.4 MG CAPS capsule Take 0.4 mg by mouth daily.    [provider]      Allergies    Patient has no known allergies.    Review of Systems   Review of Systems  Genitourinary:  Positive for dysuria.    Physical Exam Updated Vital Signs BP (!) 90/39   Pulse 78   Temp 97.7 F (36.5 C) (Oral)   Resp 19   Ht 5\' 7"  (1.702 m)   Wt 89.4 kg   SpO2 98%   BMI 30.85 kg/m  Physical Exam  ED Results / Procedures / Treatments   Labs (all labs ordered are listed, but only abnormal results are displayed) Labs Reviewed  BASIC METABOLIC PANEL  CBC WITH DIFFERENTIAL/PLATELET  URINALYSIS, W/ REFLEX TO CULTURE (INFECTION SUSPECTED)    EKG None  Radiology No results found.  Procedures Procedures  {Document cardiac monitor, telemetry assessment procedure when appropriate:1}  Medications Ordered in ED Medications  sodium chloride 0.9 % bolus 1,000 mL (has no administration in time range)    ED Course/ Medical Decision Making/ A&P   {   Click here for ABCD2, HEART  and other calculatorsREFRESH Note before signing :1}                          Medical Decision Making  ***  {Document critical care time when appropriate:1} {Document review of labs and clinical decision tools ie heart score, Chads2Vasc2 etc:1}  {Document your independent review of radiology images, and any outside records:1} {Document your discussion with family members, caretakers, and with consultants:1} {Document social determinants of health affecting pt's care:1} {Document your decision making why or why not admission, treatments were needed:1} Final Clinical Impression(s) / ED Diagnoses Final diagnoses:  None    Rx / DC Orders ED  Discharge Orders     None

## 2022-04-30 NOTE — ED Triage Notes (Addendum)
Patient brought in by [facility], reports he got catheter placed a few days ago at [place]. States he has felt unwell since, endorses pain around catheter site and weakness. EMS reports hypotension as well, BP 99991111 systolic. Pain 9/10. Patient also reports taking morning and night BP meds (lisinopril) this evening all at once.

## 2022-04-30 NOTE — ED Provider Notes (Cosign Needed Addendum)
[place] AT [facility] Provider Note   CSN: [id number] Arrival date & time: 04/30/22  2057     History  Chief Complaint  Patient presents with   Hypotension   Dysuria   Suicidal    Johnny [name] is a 72 y.o. male with medical history of chronic kidney disease, depression, diabetes, BPH, hypertension, hypothyroid.  Patient presents to the ED for evaluation of dysuria, hypertension.  Patient reports that he had a catheter placed on Wednesday at the [place].  Patient states that he had a catheter secondary to urinary retention as a result of BPH.  Patient reports that ever since having the catheter placed he has had exquisite pain around tip of his penis.  Patient states that he had immediate pain upon catheter insertion on Wednesday, his pain has remained the same since Wednesday.  Patient denies any new features to the pain.  Patient rates his pain at a 4 out of 10.  Patient states pain is worse with movement, better with rest.  Patient denies fevers, nausea, vomiting, abdominal pain, flank pain.  Patient reports that whenever he urinates he will have diarrhea however goes on to state that this is occurred for the last 1 year.  Patient niece at bedside reports patient has not been eating or drinking ever since having catheter placed.  Patient reports that he feels as if he is a burden to his niece, states he lives upstairs and his niece asked to come up and down the stairs to bring him food.  Patient goes on to state that sometimes he wishes he would just "wither away".  Patient states that he is not suicidal however reports he does have excessive medical issues and feels as if everyone's life would be better off without him.  Patient was seen for the same issue on 3/21 and ultimately discharged after TTS consult.  Patient reports that he would go home and overdose on insulin.  Patient denies access to firearms.  Patient reports he does see psychiatry at [facility], urology at  the [facility].  Of note, the patient reports that he took his morning and nighttime blood pressure medication doses this evening.  Patient reports that he forgot to take his blood pressure medication this morning and was trying to "catch up".  Patient denies that this was a suicide attempt.   Dysuria Presenting symptoms: dysuria and penile pain   Presenting symptoms: no penile discharge        Home Medications Prior to Admission medications   Medication Sig Start Date End Date Taking? Authorizing Provider  acetaminophen (TYLENOL) 650 MG CR tablet Take 650 mg by mouth every 8 (eight) hours as needed for pain.    Yes [provider]  aspirin EC 81 MG tablet Take 81 mg by mouth daily.   Yes [provider]  atorvastatin (LIPITOR) 40 MG tablet Take 40 mg by mouth at bedtime.   Yes [provider]  calcium carbonate (OSCAL) 1500 (600 Ca) MG TABS tablet Take 600 mg by mouth daily.    Yes [provider]  cephALEXin (KEFLEX) 500 MG capsule Take 1 capsule (500 mg total) by mouth 4 (four) times daily. 05/01/22  Yes Azucena Cecil, PA-C  Cholecalciferol (VITAMIN D-3) 25 MCG (1000 UT) CAPS Take 1,000 Units by mouth daily with breakfast.   Yes [provider]  CINNAMON PO Take 1,000 mg by mouth daily.    Yes [provider]  diphenhydrAMINE HCl (ZZZQUIL)  50 MG/30ML LIQD Take 50 mg by mouth at bedtime as needed (sleep).    Yes [provider]  FLUoxetine (PROZAC) 10 MG tablet Take 30 mg by mouth daily.   Yes [provider]  insulin aspart (NOVOLOG) 100 UNIT/ML injection Inject 20 Units into the skin 3 (three) times daily with meals.    Yes [provider]  insulin glargine (LANTUS) 100 UNIT/ML injection Inject 50 Units into the skin at bedtime.    Yes [provider]  levothyroxine (SYNTHROID) 25 MCG tablet Take 25 mcg by mouth daily before breakfast.   Yes [provider]  lisinopril (ZESTRIL) 10 MG  tablet Take 20 mg by mouth daily.   Yes [provider]  Multiple Vitamins-Minerals (ONE-A-DAY MENS 50+ ADVANTAGE) TABS Take 1 tablet by mouth daily with breakfast.   Yes [provider]  tamsulosin (FLOMAX) 0.4 MG CAPS capsule Take 0.4 mg by mouth daily.   Yes [provider]  atenolol (TENORMIN) 25 MG tablet Take 1 tablet (25 mg total) by mouth 2 (two) times daily. Patient not taking: Reported on 10/21/2019 07/29/18   Rai, Ripudeep K, MD  glucose blood test strip 1 each by Other route as needed (blood glucose level). Use as instructed    [provider]  Insulin Syringe-Needle U-100 (INSULIN SYRINGE .5CC/30GX1/2") 30G X 1/2" 0.5 ML MISC Inject 1 Syringe into the skin 4 (four) times daily.    [provider]  levETIRAcetam (KEPPRA) 500 MG tablet Take 1 tablet (500 mg total) by mouth 2 (two) times daily. Patient not taking: Reported on 03/22/2021 07/28/18   Rai, Vernelle Emerald, MD  ondansetron (ZOFRAN ODT) 4 MG disintegrating tablet Take 1 tablet (4 mg total) by mouth every 8 (eight) hours as needed for nausea or vomiting. Patient not taking: Reported on 05/01/2022 07/28/18   Rai, Vernelle Emerald, MD  oxyCODONE-acetaminophen (PERCOCET) 5-325 MG tablet Take 1 tablet by mouth every 6 (six) hours as needed. Patient not taking: Reported on 05/01/2022 10/02/19   Gabriel Earing, PA-C      Allergies    Patient has no known allergies.    Review of Systems   Review of Systems  Genitourinary:  Positive for dysuria and penile pain. Negative for penile discharge.  Psychiatric/Behavioral:  Positive for suicidal ideas.   All other systems reviewed and are negative.   Physical Exam Updated Vital Signs BP (!) 110/58   Pulse 72   Temp 97.7 F (36.5 C)   Resp (!) 23   Ht 5\' 7"  ([id number] m)   Wt 89.4 kg   SpO2 93%   BMI 30.85 kg/m  Physical Exam Vitals and nursing note reviewed.  Constitutional:      General: He is not in acute distress.    Appearance: Normal  appearance. He is not ill-appearing, toxic-appearing or diaphoretic.  HENT:     Head: Normocephalic and atraumatic.     Nose: Nose normal.     Mouth/Throat:     Mouth: Mucous membranes are moist.     Pharynx: Oropharynx is clear.  Eyes:     Extraocular Movements: Extraocular movements intact.     Conjunctiva/sclera: Conjunctivae normal.     Pupils: Pupils are equal, round, and reactive to light.  Cardiovascular:     Rate and Rhythm: Normal rate and regular rhythm.  Pulmonary:     Effort: Pulmonary effort is normal.     Breath sounds: Normal breath sounds. No wheezing.  Abdominal:     General:  Abdomen is flat. Bowel sounds are normal.     Palpations: Abdomen is soft.     Tenderness: There is no abdominal tenderness.  Genitourinary:    Comments: Indwelling Foley catheter noted, no surrounding erythema, no drainage or discharge from urethral meatus.  Foley catheter bag at bedside with noted urine. Musculoskeletal:     Cervical back: Normal range of motion and neck supple. No tenderness.  Skin:    General: Skin is warm and dry.     Capillary Refill: Capillary refill takes less than 2 seconds.  Neurological:     Mental Status: He is alert and oriented to person, place, and time.  Psychiatric:        Thought Content: Thought content includes suicidal ideation. Thought content includes suicidal plan.     ED Results / Procedures / Treatments   Labs (all labs ordered are listed, but only abnormal results are displayed) Labs Reviewed  BASIC METABOLIC PANEL - Abnormal; Notable for the following components:      Result Value   CO2 21 (*)    Glucose, Bld 158 (*)    BUN 34 (*)    Creatinine, Ser 2.38 (*)    GFR, Estimated 28 (*)    All other components within normal limits  CBC WITH DIFFERENTIAL/PLATELET - Abnormal; Notable for the following components:   WBC 11.2 (*)    Neutro Abs 8.0 (*)    All other components within normal limits  URINALYSIS, ROUTINE W REFLEX MICROSCOPIC -  Abnormal; Notable for the following components:   APPearance CLOUDY (*)    Hgb urine dipstick LARGE (*)    Protein, ur 30 (*)    Leukocytes,Ua MODERATE (*)    Bacteria, UA RARE (*)    All other components within normal limits  URINE CULTURE    EKG None  Radiology No results found.  Procedures Procedures   Medications Ordered in ED Medications  phenazopyridine (PYRIDIUM) tablet 100 mg (100 mg Oral Given 05/01/22 0231)  sodium chloride 0.9 % bolus 1,000 mL (0 mLs Intravenous Stopped 04/30/22 2322)  cephALEXin (KEFLEX) capsule 500 mg (500 mg Oral Given 05/01/22 0232)    ED Course/ Medical Decision Making/ A&P  Medical Decision Making Amount and/or Complexity of Data Reviewed Labs: ordered.  Risk Prescription drug management.   72 year old male presents to the ED for evaluation.  Please see HPI for further details.  On my examination the patient is afebrile and nontachycardic.  The patient lung sounds are clear bilaterally, he is not hypoxic on room air.  Abdomen is soft and compressible throughout without suprapubic abdominal tenderness.  Patient Foley catheter inspected without findings of discharge from urethral meatus, surrounding erythema.  Patient denies feelings of being unable to urinate, reports that he feels as if his Foley catheter is draining fine.  Patient tells vague story of suicidality, reports that if he was discharged to go home and overdose on insulin.  CBC with leukocytosis to 11.2 however no anemia.  BMP with baseline creatinine of 2.38, elevated BUN.  Urinalysis shows moderate leukocytes, large hemoglobin, will culture.  Patient provided Pyridium tablet, 100 fluid for hypotension, 500 mg Keflex for suspected catheter associated UTI.  Patient blood pressure has responded well to 1 L fluid.  Patient medically cleared.  TTS consult has been conducted and they are recommending inpatient geropsychiatric admission.  Patient will be turned to provider Default.   Patient will be border until placement has been found.  Patient amenable to plan.  Patient resting  comfortably in bed.   Final Clinical Impression(s) / ED Diagnoses Final diagnoses:  Suicidal ideation  Acute cystitis with hematuria  Problem with Foley catheter, subsequent encounter    Rx / DC Orders ED Discharge Orders          Ordered    cephALEXin (KEFLEX) 500 MG capsule  4 times daily        05/01/22 0221                Azucena Cecil, PA-C 05/01/22 0406    Lacretia Leigh, MD 05/02/22 1743

## 2022-05-01 ENCOUNTER — Encounter (HOSPITAL_COMMUNITY): Payer: Self-pay | Admitting: Registered Nurse

## 2022-05-01 LAB — URINALYSIS, ROUTINE W REFLEX MICROSCOPIC
Bilirubin Urine: NEGATIVE
Glucose, UA: NEGATIVE mg/dL
Ketones, ur: NEGATIVE mg/dL
Nitrite: NEGATIVE
Protein, ur: 30 mg/dL — AB
RBC / HPF: >50 RBC/hpf (ref 0–5)
Specific Gravity, Urine: 1.015 (ref 1.005–1.030)
WBC, UA: >50 WBC/hpf (ref 0–5)
pH: 5 (ref 5.0–8.0)

## 2022-05-01 LAB — RESP PANEL BY RT-PCR (RSV, FLU A&B, COVID)  RVPGX2
Influenza A by PCR: NEGATIVE
Influenza B by PCR: NEGATIVE
Resp Syncytial Virus by PCR: NEGATIVE
SARS Coronavirus 2 by RT PCR: NEGATIVE

## 2022-05-01 MED ORDER — CEPHALEXIN 500 MG PO CAPS
500.0000 mg | ORAL_CAPSULE | Freq: Once | ORAL | Status: AC
  Administered 2022-05-01: 500 mg via ORAL
  Filled 2022-05-01: qty 1

## 2022-05-01 MED ORDER — CEPHALEXIN 500 MG PO CAPS: 500.0000 mg | ORAL_CAPSULE | Freq: Four times a day (QID) | ORAL | 0 refills | Status: AC

## 2022-05-01 MED ORDER — CEPHALEXIN 500 MG PO CAPS: 500.0000 mg | ORAL_CAPSULE | Freq: Four times a day (QID) | ORAL | 0 refills | Status: DC

## 2022-05-01 MED ORDER — PHENAZOPYRIDINE HCL 100 MG PO TABS: 100.0000 mg | ORAL_TABLET | Freq: Three times a day (TID) | ORAL | 0 refills | Status: AC

## 2022-05-01 MED ORDER — AMOXICILLIN-POT CLAVULANATE 875-125 MG PO TABS
1.0000 | ORAL_TABLET | Freq: Two times a day (BID) | ORAL | Status: DC
  Administered 2022-05-01: 1 via ORAL
  Filled 2022-05-01: qty 1

## 2022-05-01 MED ORDER — ACETAMINOPHEN 325 MG PO TABS
650.0000 mg | ORAL_TABLET | Freq: Once | ORAL | Status: AC
  Administered 2022-05-01: 650 mg via ORAL
  Filled 2022-05-01: qty 2

## 2022-05-01 MED ORDER — CEPHALEXIN 500 MG PO CAPS
500.0000 mg | ORAL_CAPSULE | Freq: Three times a day (TID) | ORAL | Status: DC
  Administered 2022-05-02 – 2022-05-04 (×8): 500 mg via ORAL
  Filled 2022-05-01 (×7): qty 1

## 2022-05-01 MED ORDER — PHENAZOPYRIDINE HCL 100 MG PO TABS
100.0000 mg | ORAL_TABLET | Freq: Three times a day (TID) | ORAL | Status: DC
  Administered 2022-05-01 – 2022-05-02 (×5): 100 mg via ORAL
  Filled 2022-05-01 (×7): qty 1

## 2022-05-01 NOTE — ED Notes (Signed)
Webb Silversmith [phone] niece will come back and get him.

## 2022-05-01 NOTE — ED Notes (Signed)
Pt remains calm and cooperative 

## 2022-05-01 NOTE — Progress Notes (Signed)
Follow-up on [place] Referral:  CSW coordinated with ED nursing staff Gwenith Daily, RN and EDP Regan Lemming, MD who agreed to assist with completing [facility] form [id number] Certification And Patient Consent for Transfer.   CSW sent completed [facility] referral to afterhours AOD via fax at [phone]. CSW will follow up via phone.   Benjaman Kindler, MSW, [facility] 05/01/2022 6:36 PM

## 2022-05-01 NOTE — Consult Note (Signed)
[place] ED ASSESSMENT   Reason for Consult:  Suicidal ideation Referring Physician:  Azucena Cecil, PA-C  Patient Identification: Johnny [name] MRN:  [id number] ED Chief Complaint: Adjustment disorder with mixed anxiety and depressed mood  Diagnosis:  Principal Problem:   Adjustment disorder with depressed mood   ED Assessment Time Calculation: Start Time: 1100 Stop Time: 1130 Total Time in Minutes (Assessment Completion): 30   Subjective:   [name] is a 73 yr. male patient admitted to [facility] ED with complaints of with complaints of pain in his penis from a indwelling urinary catheter, suicidal ideations with a plan to OD on insulin.  HPI:  Johnny [name] seen face to face by this provider, consulted with Dr. Hampton Abbot; and chart reviewed on 05/01/22.  On evaluation [name] reports he continues to have suicidal thoughts with a plan to use his insulin to kill himself.  He is unable to contract for safety.  He reports his that his primary stressors are the pain from urinary catheter.  States it was place 2 days ago and he is to keep it for 30 days.  States has cath related to enlarged prostate.  Other stressors if feeling that he is a burden, and not having enough money to get all of his medical issues taken care of.  Patient states the only way he won't kill himself is if he win [employer] or [place] on Wednesday.  He was unable to contract for safety stating it was determined on if he won the lottery "I would have money to give my family, and pay for my medical."  Patient denies homicidal ideation, psychosis, and paranoia.   During evaluation Johnny [name] is lying in bed with no noted distress.  He is alert/oriented x 4, calm, cooperative, attentive, and responses were relevant and appropriate to assessment questions.  He spoke in a clear tone at moderate volume, and normal pace, with good eye contact.   He denies homicidal ideation, psychosis, and paranoia.   Objectively:  there is no evidence of psychosis/mania or delusional thinking.  He conversed coherently, with goal directed thoughts, and no distractibility, or pre-occupation.  Will continue to recommend inpatient psychiatric treatment  Past Psychiatric History: Depression, anxiety  Risk to Self or Others: Is the patient at risk to self? Yes Has the patient been a risk to self in the past 6 months? No Has the patient been a risk to self within the distant past? No Is the patient a risk to others? No Has the patient been a risk to others in the past 6 months? No Has the patient been a risk to others within the distant past? No  [place] Scale:  [place] ED from 04/30/2022 in [facility] Emergency Department at [facility] ED from 04/27/2022 in [facility] Emergency Department at [facility] Admission (Discharged) from 10/01/2019 in [facility] 4E CV SURGICAL PROGRESSIVE CARE  C-SSRS RISK CATEGORY High Risk High Risk High Risk       AIMS:  , , ,  ,   ASAM: ASAM Multidimensional Assessment Summary Dimension 1:  Description of individual's past and current experiences of substance use and withdrawal: none DImension 1:  Acute Intoxication and/or Withdrawal Potential Severity Rating:  (n/a) Dimension 2:  Description of patient's biomedical conditions and  complications: none Dimension 2:  Biomedical Conditions and Complications Severity Rating:  (n/a) Dimension 3:  Description of emotional, behavioral, or cognitive conditions and complications: none  Dimension 3:  Emotional, behavioral or cognitive (EBC) conditions and complications severity rating:  (n/a) Dimension 4:  Description of Readiness to Change criteria: none Dimension 4:  Readiness to Change Severity Rating:  (n/a) Dimension 5:  Relapse, continued use, or continued problem potential critiera description: none Dimension 5:  Relapse, continued use, or continued problem potential severity rating:  (n/a) Dimension 6:   Recovery/Iiving environment criteria description: none Dimension 6:  Recovery/living environment severity rating:  (n/a) ASAM Recommended Level of Treatment:  (n/a)  Substance Abuse:  Alcohol / Drug Use Pain Medications: SEE MAR Prescriptions: SEE MAR Over the Counter: SEE MAR History of alcohol / drug use?: Yes (Pt denies any recent use of ETOH) Longest period of sobriety (when/how long): N/A Negative Consequences of Use:  (none) Withdrawal Symptoms: None  Past Medical History:  Past Medical History:  Diagnosis Date   Anxiety    Chronic kidney disease    Depression    Diabetes mellitus without complication (HCC)    Type 1   Hyperlipemia    Hypertension    Hypothyroidism    SDH (subdural hematoma) (HCC) 07/26/2018   from fall   Sleep apnea     Past Surgical History:  Procedure Laterality Date   APPENDECTOMY     ENDARTERECTOMY Right 10/01/2019   Procedure: ENDARTERECTOMY CAROTID; PATCH ANGIOPLASTY;  Surgeon: Marty Heck, MD;  Location: [facility] OR;  Service: Vascular;  Laterality: Right;   EYE SURGERY     PATCH ANGIOPLASTY Right 10/01/2019   Procedure: PATCH ANGIOPLASTY USING Theodore;  Surgeon: Marty Heck, MD;  Location: [facility] OR;  Service: Vascular;  Laterality: Right;   Family History:  Family History  Family history unknown: Yes   Family Psychiatric  History: None reported Family History  Family history unknown: Yes    Social History:  Social History   Substance and Sexual Activity  Alcohol Use No     Social History   Substance and Sexual Activity  Drug Use No    Social History   Socioeconomic History   Marital status: Single    Spouse name: Not on file   Number of children: Not on file   Years of education: Not on file   Highest education level: Not on file  Occupational History   Not on file  Tobacco Use   Smoking status: Every Day    Types: Pipe   Smokeless tobacco: Never  Vaping Use   Vaping Use: Never used   Substance and Sexual Activity   Alcohol use: No   Drug use: No   Sexual activity: Not on file  Other Topics Concern   Not on file  Social History Narrative   Not on file   Social Determinants of Health   Financial Resource Strain: Not on file  Food Insecurity: Not on file  Transportation Needs: Not on file  Physical Activity: Not on file  Stress: Not on file  Social Connections: Not on file   Additional Social History:    Allergies:  No Known Allergies  Labs:  Results for orders placed or performed during the hospital encounter of 04/30/22 (from the past 48 hour(s))  Basic metabolic panel     Status: Abnormal   Collection Time: 04/30/22 10:37 PM  Result Value Ref Range   Sodium 141 135 - 145 mmol/L   Potassium 3.8 3.5 - 5.1 mmol/L   Chloride 111 98 - 111 mmol/L   CO2 21 (L) 22 - 32 mmol/L  Glucose, Bld 158 (H) 70 - 99 mg/dL    Comment: Glucose reference range applies only to samples taken after fasting for at least 8 hours.   BUN 34 (H) 8 - 23 mg/dL   Creatinine, Ser 2.38 (H) 0.61 - 1.24 mg/dL   Calcium 9.7 8.9 - 10.3 mg/dL   GFR, Estimated 28 (L) >60 mL/min    Comment: (NOTE) Calculated using the CKD-EPI Creatinine Equation (2021)    Anion gap 9 5 - 15    Comment: Performed at [facility], [place] [address] [place], [place] [address]  CBC with Differential     Status: Abnormal   Collection Time: 04/30/22 10:37 PM  Result Value Ref Range   WBC 11.2 (H) 4.0 - 10.5 K/uL   RBC 4.63 4.22 - 5.81 MIL/uL   Hemoglobin 14.6 13.0 - 17.0 g/dL   HCT 43.5 39.0 - 52.0 %   MCV 94.0 80.0 - 100.0 fL   MCH 31.5 26.0 - 34.0 pg   MCHC 33.6 30.0 - 36.0 g/dL   RDW 12.9 11.5 - 15.5 %   Platelets 202 150 - 400 K/uL   nRBC 0.0 0.0 - 0.2 %   Neutrophils Relative % 73 %   Neutro Abs 8.0 (H) 1.7 - 7.7 K/uL   Lymphocytes Relative 18 %   Lymphs Abs 2.1 0.7 - 4.0 K/uL   Monocytes Relative 6 %   Monocytes Absolute 0.7 0.1 - 1.0 K/uL   Eosinophils Relative 3 %    Eosinophils Absolute 0.3 0.0 - 0.5 K/uL   Basophils Relative 0 %   Basophils Absolute 0.0 0.0 - 0.1 K/uL   Immature Granulocytes 0 %   Abs Immature Granulocytes 0.04 0.00 - 0.07 K/uL    Comment: Performed at [facility], [place] [address] [place], [place] [address]  Urinalysis, Routine w reflex microscopic -Urine, Catheterized; Indwelling urinary catheter     Status: Abnormal   Collection Time: 05/01/22 12:03 AM  Result Value Ref Range   Color, Urine YELLOW YELLOW   APPearance CLOUDY (A) CLEAR   Specific Gravity, Urine 1.015 1.005 - 1.030   pH 5.0 5.0 - 8.0   Glucose, UA NEGATIVE NEGATIVE mg/dL   Hgb urine dipstick LARGE (A) NEGATIVE   Bilirubin Urine NEGATIVE NEGATIVE   Ketones, ur NEGATIVE NEGATIVE mg/dL   Protein, ur 30 (A) NEGATIVE mg/dL   Nitrite NEGATIVE NEGATIVE   Leukocytes,Ua MODERATE (A) NEGATIVE   RBC / HPF >50 0 - 5 RBC/hpf   WBC, UA >50 0 - 5 WBC/hpf   Bacteria, UA RARE (A) NONE SEEN   Squamous Epithelial / HPF 0-5 0 - 5 /HPF   WBC Clumps PRESENT    Mucus PRESENT     Comment: Performed at [facility], [place] [address], [place], [place] [address]  Resp panel by RT-PCR (RSV, Flu A&B, Covid) Anterior Nasal Swab     Status: None   Collection Time: 05/01/22  1:50 PM   Specimen: Anterior Nasal Swab  Result Value Ref Range   SARS Coronavirus 2 by RT PCR NEGATIVE NEGATIVE    Comment: (NOTE) SARS-CoV-2 target nucleic acids are NOT DETECTED.  The SARS-CoV-2 RNA is generally detectable in upper respiratory specimens during the acute phase of infection. The lowest concentration of SARS-CoV-2 viral copies this assay can detect is 138 copies/mL. A negative result does not preclude SARS-Cov-2 infection and should not be used as the sole basis for treatment or other patient management decisions. A negative  result may occur with  improper specimen collection/handling, submission of specimen other than nasopharyngeal swab, presence of viral  mutation(s) within the areas targeted by this assay, and inadequate number of viral copies(<138 copies/mL). A negative result must be combined with clinical observations, patient history, and epidemiological information. The expected result is Negative.  Fact Sheet for Patients:  EntrepreneurPulse.com.au  Fact Sheet for Healthcare Providers:  IncredibleEmployment.be  This test is no t yet approved or cleared by the [place] FDA and  has been authorized for detection and/or diagnosis of SARS-CoV-2 by FDA under an Emergency Use Authorization (EUA). This EUA will remain  in effect (meaning this test can be used) for the duration of the COVID-19 declaration under Section 564(b)(1) of the Act, 21 U.S.C.section 360bbb-3(b)(1), unless the authorization is terminated  or revoked sooner.       Influenza A by PCR NEGATIVE NEGATIVE   Influenza B by PCR NEGATIVE NEGATIVE    Comment: (NOTE) The Xpert Xpress SARS-CoV-2/FLU/RSV plus assay is intended as an aid in the diagnosis of influenza from Nasopharyngeal swab specimens and should not be used as a sole basis for treatment. Nasal washings and aspirates are unacceptable for Xpert Xpress SARS-CoV-2/FLU/RSV testing.  Fact Sheet for Patients: EntrepreneurPulse.com.au  Fact Sheet for Healthcare Providers: IncredibleEmployment.be  This test is not yet approved or cleared by the [place] FDA and has been authorized for detection and/or diagnosis of SARS-CoV-2 by FDA under an Emergency Use Authorization (EUA). This EUA will remain in effect (meaning this test can be used) for the duration of the COVID-19 declaration under Section 564(b)(1) of the Act, 21 U.S.C. section 360bbb-3(b)(1), unless the authorization is terminated or revoked.     Resp Syncytial Virus by PCR NEGATIVE NEGATIVE    Comment: (NOTE) Fact Sheet for  Patients: EntrepreneurPulse.com.au  Fact Sheet for Healthcare Providers: IncredibleEmployment.be  This test is not yet approved or cleared by the [place] FDA and has been authorized for detection and/or diagnosis of SARS-CoV-2 by FDA under an Emergency Use Authorization (EUA). This EUA will remain in effect (meaning this test can be used) for the duration of the COVID-19 declaration under Section 564(b)(1) of the Act, 21 U.S.C. section 360bbb-3(b)(1), unless the authorization is terminated or revoked.  Performed at [facility], [place] [address], [place], [place] [address]     Current Facility-Administered Medications  Medication Dose Route Frequency Provider Last Rate Last Admin   amoxicillin-clavulanate (AUGMENTIN) 875-125 MG per tablet 1 tablet  1 tablet Oral Q12H Cristie Hem, MD   1 tablet at 05/01/22 1238   phenazopyridine (PYRIDIUM) tablet 100 mg  100 mg Oral TID WC Genevive Bi F, PA-C   100 mg at 05/01/22 1238   Current Outpatient Medications  Medication Sig Dispense Refill   acetaminophen (TYLENOL) 650 MG CR tablet Take 650 mg by mouth every 8 (eight) hours as needed for pain.      aspirin EC 81 MG tablet Take 81 mg by mouth daily.     atorvastatin (LIPITOR) 40 MG tablet Take 40 mg by mouth at bedtime.     calcium carbonate (OSCAL) 1500 (600 Ca) MG TABS tablet Take 600 mg by mouth daily.      cephALEXin (KEFLEX) 500 MG capsule Take 1 capsule (500 mg total) by mouth 4 (four) times daily. 28 capsule 0   Cholecalciferol (VITAMIN D-3) 25 MCG (1000 UT) CAPS Take 1,000 Units by mouth daily with breakfast.     CINNAMON PO Take 1,000 mg  by mouth daily.      diphenhydrAMINE HCl (ZZZQUIL) 50 MG/30ML LIQD Take 50 mg by mouth at bedtime as needed (sleep).      FLUoxetine (PROZAC) 10 MG tablet Take 30 mg by mouth daily.     insulin aspart (NOVOLOG) 100 UNIT/ML injection Inject 20 Units into the skin 3 (three) times  daily with meals.      insulin glargine (LANTUS) 100 UNIT/ML injection Inject 50 Units into the skin at bedtime.      levothyroxine (SYNTHROID) 25 MCG tablet Take 25 mcg by mouth daily before breakfast.     lisinopril (ZESTRIL) 10 MG tablet Take 20 mg by mouth daily.     Multiple Vitamins-Minerals (ONE-A-DAY MENS 50+ ADVANTAGE) TABS Take 1 tablet by mouth daily with breakfast.     tamsulosin (FLOMAX) 0.4 MG CAPS capsule Take 0.4 mg by mouth daily.     atenolol (TENORMIN) 25 MG tablet Take 1 tablet (25 mg total) by mouth 2 (two) times daily. (Patient not taking: Reported on 10/21/2019)     levETIRAcetam (KEPPRA) 500 MG tablet Take 1 tablet (500 mg total) by mouth 2 (two) times daily. (Patient not taking: Reported on 03/22/2021) 60 tablet 4   ondansetron (ZOFRAN ODT) 4 MG disintegrating tablet Take 1 tablet (4 mg total) by mouth every 8 (eight) hours as needed for nausea or vomiting. (Patient not taking: Reported on 05/01/2022) 20 tablet 0   oxyCODONE-acetaminophen (PERCOCET) 5-325 MG tablet Take 1 tablet by mouth every 6 (six) hours as needed. (Patient not taking: Reported on 05/01/2022) 8 tablet 0    Musculoskeletal: Strength & Muscle Tone: within normal limits Gait & Station: normal Patient leans: N/A   Psychiatric Specialty Exam: Presentation  General Appearance:  Appropriate for Environment  Eye Contact: Good  Speech: Clear and Coherent; Normal Rate  Speech Volume: Normal  Handedness: Right   Mood and Affect  Mood: Dysphoric  Affect: Congruent   Thought Process  Thought Processes: Coherent  Descriptions of Associations:Intact  Orientation:Full (Time, Place and Person)  Thought Content:Logical  History of Schizophrenia/Schizoaffective disorder:No  Duration of Psychotic Symptoms:No data recorded Hallucinations:Hallucinations: None  Ideas of Reference:None  Suicidal Thoughts:Suicidal Thoughts: Yes, Active SI Active Intent and/or Plan: With Plan; Without  Intent; With Means to Carry Out (unable to contract for safety)  Homicidal Thoughts:Homicidal Thoughts: No   Sensorium  Memory: Immediate Good; Recent Good; Remote Good  Judgment: Fair  Insight: Fair   Community education officer  Concentration: Good  Attention Span: Good  Recall: Good  Fund of Knowledge: Good  Language: Good   Psychomotor Activity  Psychomotor Activity: Psychomotor Activity: Normal   Assets  Assets: Communication Skills; Desire for Improvement; Financial Resources/Insurance; Housing; Social Support; Leisure Time    Sleep  Sleep: Sleep: Fair   Physical Exam: Physical Exam Vitals and nursing note reviewed. Exam conducted with a chaperone present.  Constitutional:      General: He is not in acute distress.    Appearance: Normal appearance. He is not ill-appearing.  HENT:     Head: Normocephalic.  Eyes:     Conjunctiva/sclera: Conjunctivae normal.  Cardiovascular:     Rate and Rhythm: Normal rate.  Pulmonary:     Effort: Pulmonary effort is normal.  Skin:    General: Skin is warm and dry.  Neurological:     Mental Status: He is alert and oriented to person, place, and time.  Psychiatric:        Attention and Perception: Attention and perception normal. He  does not perceive visual hallucinations.        Mood and Affect: Mood is anxious and depressed.        Speech: Speech normal.        Thought Content: Thought content is not paranoid or delusional. Thought content includes suicidal ideation. Thought content does not include homicidal ideation. Thought content includes suicidal plan.        Cognition and Memory: Cognition normal.        Judgment: Judgment is impulsive.    Review of Systems  Constitutional:  Negative for chills and fever.  Genitourinary:        Complaints of penis pain related to catheter   Musculoskeletal:  Positive for back pain and myalgias.  Psychiatric/Behavioral:  Positive for depression and suicidal ideas.  Negative for hallucinations. The patient is nervous/anxious.    Blood pressure 138/63, pulse 79, temperature 98.5 F (36.9 C), temperature source Oral, resp. rate 15, height 5\' 7"  (1.702 m), weight 89.4 kg, SpO2 98 %. Body mass index is 30.85 kg/m.  Medical Decision Making:   Problem 1: Recommended for inpatient psychiatric treatment  Problem 2: Restart psychotropic medications: Prozac 30 mg daily  Problem 3: Social work to assist with finding placement for inpatient psychiatric treatment.  Disposition: Recommend psychiatric Inpatient admission when medically cleared.  Keishawna Carranza, NP 05/01/2022 3:33 PM

## 2022-05-01 NOTE — Discharge Instructions (Addendum)
Continue taking the antibiotics as prescribed for your urinary tract infection.  Follow-up with your primary care doctor regarding your visit to the ER today.  Go to the [facility] if having any thoughts of suicidal ideation, homicidal ideation or new hallucinations or any other behavioral concerns.

## 2022-05-01 NOTE — Progress Notes (Addendum)
ADDENDUM  CSW received return phone call from [facility] transfer coordinator, April [phone] ext. [phone] at 1:03 PM. April confirmed that [facility] has available beds for inpatient treatment. CSW to complete referral packet and fax over to [facility] [phone] for review.   CSW attempted to call [facility] transfer coordinator, April [phone] ext. [phone] at 12:36 PM, to inquire about inpatient bed availability for inpatient treatment. CSW was unable to speak with April, but left a message requesting a call back. CSW will continue to assist and follow with placement.  Johnny [name]  05/01/2022 12:39 PM

## 2022-05-01 NOTE — Progress Notes (Signed)
CSW faxed completed referral packet to [facility] [phone] at 3:17 PM. CSW to await confirmation of referral being received by [facility], before completing [id number] consent for transfer form. 2nd shift CSW to follow up. CSW will continue to assist and follow with placement.  Herbie [place]  05/01/2022 3:23 PM

## 2022-05-01 NOTE — Progress Notes (Signed)
CSW received phone call from transfer coordinator, April, at [place] at 4:00 PM. April reports that referral has been received. Pt is currently under review at [facility] for inpatient treatment. April reports [id number] can be completed and signed by MD. Once [id number] form is signed, CSW will fax to [facility] [phone]. CSW sent secure chat message to inquire about EDP signing [id number]. CSW awaiting on response. 2nd shift CSW to follow up.  Denna Haggard, [place]  05/01/2022 4:09 PM

## 2022-05-01 NOTE — ED Provider Notes (Signed)
Emergency Medicine Observation Re-evaluation Note  Johnny [name] is a 72 y.o. male, seen on rounds today.  Pt initially presented to the ED for complaints of Hypotension, Dysuria, and Suicidal Currently, the patient is resting comfortably. Denies any complaints.  Physical Exam  BP 102/68 (BP Location: Left Arm)   Pulse 73   Temp 98.5 F (36.9 C) (Oral)   Resp 15   Ht 5\' 7"  (1.702 m)   Wt 89.4 kg   SpO2 97%   BMI 30.85 kg/m  Physical Exam General: no distress Cardiac: regular rate Lungs: normal work of breathing Psych: calm, cooperative  ED Course / MDM  EKG:   I have reviewed the labs performed to date as well as medications administered while in observation.  Recent changes in the last 24 hours include arrived in ER for penile pain from foley as well as sucidality. Seen by TTS who recommended inpatient. Initially with some hypotension, patient reported taking too much blood pressure to "catch up". Blood pressure improved  Plan  Current plan is for inpatient geripsych admission.    Cristie Hem, MD 05/01/22 [phone]
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
## 2022-05-01 NOTE — Progress Notes (Signed)
Follow-up on [place] Referral:   At 8:21pm this CSW received a phone call from the [place] requesting that the fax be sent again because they are only getting some of the pages from the referral.  At 8:40pm this CSW received another call from [place] sharing the same information. CSW informed that CSW has used two different fax machines to send referral. CSW asked for another fax number to send the information or a secure email. AOD informed that there is no other option besides the list AOD fax [phone].  At 9:21pm this CSW received a call from [place] requested that CSW stop sending the referral and to send it during first shift to Care Coordination and 8am. This CSW explained that CSW has attempted to use 3 different fax machines to send the information. AOD apologized and shared that they would note that the referral would be sent during 1st shift to Care Coordination due to issues with the fax. AOD explained that they are unplugging the fax machine because they keep receiving the same 20 pages over and over. 1st shift CSW to fax full referral to Care Coordination and follow up via phone.   Benjaman Kindler, MSW, [facility] 05/01/2022 9:31 PM

## 2022-05-02 LAB — CBG MONITORING, ED
Glucose-Capillary: 270 mg/dL — ABNORMAL HIGH (ref 70–99)
Glucose-Capillary: 221 mg/dL — ABNORMAL HIGH (ref 70–99)

## 2022-05-02 MED ORDER — FINASTERIDE 5 MG PO TABS
5.0000 mg | ORAL_TABLET | Freq: Every day | ORAL | Status: DC
  Administered 2022-05-03 – 2022-05-04 (×2): 5 mg via ORAL
  Filled 2022-05-02 (×2): qty 1

## 2022-05-02 MED ORDER — ADULT MULTIVITAMIN W/MINERALS CH
1.0000 | ORAL_TABLET | Freq: Every day | ORAL | Status: DC
  Administered 2022-05-03 – 2022-05-04 (×2): 1 via ORAL
  Filled 2022-05-02 (×2): qty 1

## 2022-05-02 MED ORDER — DIPHENHYDRAMINE HCL 25 MG PO CAPS
50.0000 mg | ORAL_CAPSULE | Freq: Every evening | ORAL | Status: DC | PRN
  Administered 2022-05-02: 50 mg via ORAL
  Filled 2022-05-02: qty 2

## 2022-05-02 MED ORDER — ACETAMINOPHEN 325 MG PO TABS: 650.0000 mg | ORAL_TABLET | Freq: Three times a day (TID) | ORAL | Status: DC | PRN

## 2022-05-02 MED ORDER — INSULIN GLARGINE-YFGN 100 UNIT/ML ~~LOC~~ SOLN
50.0000 [IU] | Freq: Every day | SUBCUTANEOUS | Status: DC
  Administered 2022-05-02 – 2022-05-03 (×2): 50 [IU] via SUBCUTANEOUS
  Filled 2022-05-02 (×3): qty 0.5

## 2022-05-02 MED ORDER — INSULIN ASPART 100 UNIT/ML IJ SOLN
0.0000 [IU] | Freq: Three times a day (TID) | INTRAMUSCULAR | Status: DC
  Administered 2022-05-02: 8 [IU] via SUBCUTANEOUS
  Administered 2022-05-03: 2 [IU] via SUBCUTANEOUS
  Administered 2022-05-03 (×2): 5 [IU] via SUBCUTANEOUS
  Administered 2022-05-04 (×2): 3 [IU] via SUBCUTANEOUS
  Filled 2022-05-02: qty 0.15

## 2022-05-02 MED ORDER — LISINOPRIL 10 MG PO TABS
5.0000 mg | ORAL_TABLET | Freq: Every day | ORAL | Status: DC
  Administered 2022-05-02 – 2022-05-03 (×2): 5 mg via ORAL
  Filled 2022-05-02 (×2): qty 1

## 2022-05-02 MED ORDER — INSULIN ASPART 100 UNIT/ML IJ SOLN
0.0000 [IU] | Freq: Three times a day (TID) | INTRAMUSCULAR | Status: DC
  Filled 2022-05-02: qty 0.15

## 2022-05-02 MED ORDER — CALCIUM CARBONATE 1250 (500 CA) MG PO TABS
1.0000 | ORAL_TABLET | Freq: Every day | ORAL | Status: DC
  Administered 2022-05-03 – 2022-05-04 (×2): 1250 mg via ORAL
  Filled 2022-05-02 (×2): qty 1

## 2022-05-02 MED ORDER — LEVOTHYROXINE SODIUM 25 MCG PO TABS
25.0000 ug | ORAL_TABLET | Freq: Every day | ORAL | Status: DC
  Administered 2022-05-03 – 2022-05-04 (×2): 25 ug via ORAL
  Filled 2022-05-02 (×2): qty 1

## 2022-05-02 MED ORDER — FLUOXETINE HCL 10 MG PO CAPS
30.0000 mg | ORAL_CAPSULE | Freq: Every day | ORAL | Status: DC
  Administered 2022-05-03 – 2022-05-04 (×2): 30 mg via ORAL
  Filled 2022-05-02 (×2): qty 3

## 2022-05-02 MED ORDER — VITAMIN D 25 MCG (1000 UNIT) PO TABS
1000.0000 [IU] | ORAL_TABLET | Freq: Every day | ORAL | Status: DC
  Administered 2022-05-03 – 2022-05-04 (×2): 1000 [IU] via ORAL
  Filled 2022-05-02 (×2): qty 1

## 2022-05-02 MED ORDER — LEVOTHYROXINE SODIUM 25 MCG PO TABS: 25.0000 ug | ORAL_TABLET | Freq: Every day | ORAL | Status: DC

## 2022-05-02 MED ORDER — TAMSULOSIN HCL 0.4 MG PO CAPS
0.4000 mg | ORAL_CAPSULE | Freq: Every day | ORAL | Status: DC
  Administered 2022-05-03 – 2022-05-04 (×2): 0.4 mg via ORAL
  Filled 2022-05-02 (×2): qty 1

## 2022-05-02 MED ORDER — ASPIRIN 81 MG PO TBEC
81.0000 mg | DELAYED_RELEASE_TABLET | Freq: Every day | ORAL | Status: DC
  Administered 2022-05-02 – 2022-05-04 (×3): 81 mg via ORAL
  Filled 2022-05-02 (×3): qty 1

## 2022-05-02 MED ORDER — INSULIN ASPART 100 UNIT/ML IJ SOLN
20.0000 [IU] | Freq: Three times a day (TID) | INTRAMUSCULAR | Status: DC
  Filled 2022-05-02: qty 0.2

## 2022-05-02 MED ORDER — ACETAMINOPHEN 500 MG PO TABS
1000.0000 mg | ORAL_TABLET | Freq: Once | ORAL | Status: AC
  Administered 2022-05-02: 1000 mg via ORAL
  Filled 2022-05-02: qty 2

## 2022-05-02 MED ORDER — INSULIN ASPART 100 UNIT/ML ~~LOC~~ SOLN: 20.0000 [IU] | Freq: Three times a day (TID) | SUBCUTANEOUS | Status: DC

## 2022-05-02 MED ORDER — ATORVASTATIN CALCIUM 40 MG PO TABS
40.0000 mg | ORAL_TABLET | Freq: Every day | ORAL | Status: DC
  Administered 2022-05-02 – 2022-05-03 (×2): 40 mg via ORAL
  Filled 2022-05-02 (×2): qty 1

## 2022-05-02 NOTE — Progress Notes (Signed)
CSW spoke with transfer coordinator, April [phone] ext. [phone], at [facility] via phone call at 9:37 AM. April confirmed that [id number] form has been received. April reports that [facility] provider is reviewing pt's referral for inpatient admission right now. April to call this CSW back with admission decision once pt has been fully reviewed.  Denna Haggard, [place]  05/02/2022 9:45 AM

## 2022-05-02 NOTE — ED Provider Notes (Signed)
PreDM discontinued per pharmacy recommendations based on the patient's renal function.  Home medications reordered including insulin.  Repeat CBG ordered.    Fransico Meadow, MD 05/02/22 [phone]

## 2022-05-02 NOTE — Progress Notes (Signed)
[facility] ED Progress Note  05/02/2022 1:26 PM Johnny [name]  MRN:  [id number]  HPI:  Johnny [name] seen face to face by this provider, consulted with Dr. Hampton Abbot; and chart reviewed on 05/01/22.  On evaluation Johnny [name] reports he continues to have suicidal thoughts with a plan to use his insulin to kill himself.  He is unable to contract for safety.  He reports his that his primary stressors are the pain from urinary catheter.  States it was place 2 days ago and he is to keep it for 30 days.  States has cath related to enlarged prostate.  Other stressors if feeling that he is a burden, and not having enough money to get all of his medical issues taken care of.  Patient states the only way he won't kill himself is if he win [employer] or [place] on Wednesday.  He was unable to contract for safety stating it was determined on if he won the lottery "I would have money to give my family, and pay for my medical."  Patient denies homicidal ideation, psychosis, and paranoia.   During evaluation Johnny [name] is lying in bed with no noted distress.  He is alert/oriented x 4, calm, cooperative, attentive, and responses were relevant and appropriate to assessment questions.  He spoke in a clear tone at moderate volume, and normal pace, with good eye contact.   He denies homicidal ideation, psychosis, and paranoia.  Objectively:  there is no evidence of psychosis/mania or delusional thinking.  He conversed coherently, with goal directed thoughts, and no distractibility, or pre-occupation.  Will continue to recommend inpatient psychiatric treatment  Subjective:  "Still the same"  Johnny [name] seen face to face by this provider for psychiatric reassessment, consulted with Dr. Hampton Abbot; and chart reviewed on 05/02/22.  On evaluation Johnny [name] reports he continues to feel suicidal and unable to contract for safety.  Patient does report that he feels " marginally better and more  optimistic since I talk to my ex-wife.  She is very sad about me wanting to kill myself."  Reports his ex-wife is his main supporter.  Reports they talk to each other daily.  Patient denies homicidal ideations, psychosis, paranoia.  Continue to recommend inpatient psychiatric treatment. During evaluation Johnny [name] is elevated up in bed with no noted distress.  He is alert/oriented x 4, calm, cooperative, attentive, and responses were relevant and appropriate to assessment questions.  He spoke in a clear tone at moderate volume, and normal pace, with good eye contact.   He denies homicidal ideation, psychosis, and paranoia but continues to endorse suicidal ideation with intent, plan, and means.  Unable to contract for safety..  Objectively:  there is no evidence of psychosis/mania or delusional thinking.  He conversed coherently, with goal directed thoughts, and no distractibility, or pre-occupation.  Principal Problem: Adjustment disorder with mixed anxiety and depressed mood Diagnosis:  Principal Problem:   Adjustment disorder with depressed mood Active Problems:   Suicidal ideations   ED Assessment Time Calculation: Start Time: 1200 Stop Time: 1215 Total Time in Minutes (Assessment Completion): 15   Past Psychiatric History: Depression, Anxiety  [place] Scale:  [place] ED from 04/30/2022 in [facility] Emergency Department at [facility] ED from 04/27/2022 in [facility] Emergency Department at [facility] Admission (Discharged) from 10/01/2019 in [facility] 4E CV SURGICAL PROGRESSIVE CARE  C-SSRS RISK CATEGORY High Risk High Risk High  Risk       Past Medical History:  Past Medical History:  Diagnosis Date   Anxiety    Chronic kidney disease    Depression    Diabetes mellitus without complication (HCC)    Type 1   Hyperlipemia    Hypertension    Hypothyroidism    SDH (subdural hematoma) ([place]) 07/26/2018   from fall   Sleep apnea     Past Surgical History:   Procedure Laterality Date   APPENDECTOMY     ENDARTERECTOMY Right 10/01/2019   Procedure: ENDARTERECTOMY CAROTID; PATCH ANGIOPLASTY;  Surgeon: Marty Heck, MD;  Location: [facility] OR;  Service: Vascular;  Laterality: Right;   EYE SURGERY     PATCH ANGIOPLASTY Right 10/01/2019   Procedure: PATCH ANGIOPLASTY USING [place];  Surgeon: Marty Heck, MD;  Location: [facility];  Service: Vascular;  Laterality: Right;   Family History:  Family History  Family history unknown: Yes   Family Psychiatric  History: None reported Social History:  Social History   Substance and Sexual Activity  Alcohol Use No     Social History   Substance and Sexual Activity  Drug Use No    Social History   Socioeconomic History   Marital status: Single    Spouse name: Not on file   Number of children: Not on file   Years of education: Not on file   Highest education level: Not on file  Occupational History   Not on file  Tobacco Use   Smoking status: Every Day    Types: Pipe   Smokeless tobacco: Never  Vaping Use   Vaping Use: Never used  Substance and Sexual Activity   Alcohol use: No   Drug use: No   Sexual activity: Not on file  Other Topics Concern   Not on file  Social History Narrative   Not on file   Social Determinants of Health   Financial Resource Strain: Not on file  Food Insecurity: Not on file  Transportation Needs: Not on file  Physical Activity: Not on file  Stress: Not on file  Social Connections: Not on file    Sleep: Good, Improved  Appetite:  Good  Current Medications: Current Facility-Administered Medications  Medication Dose Route Frequency Provider Last Rate Last Admin   cephALEXin (KEFLEX) capsule 500 mg  500 mg Oral Q8H Regan Lemming, MD   500 mg at 05/02/22 [id number]   phenazopyridine (PYRIDIUM) tablet 100 mg  100 mg Oral TID WC Genevive Bi F, PA-C   100 mg at 05/02/22 1250   Current Outpatient Medications  Medication  Sig Dispense Refill   acetaminophen (TYLENOL) 650 MG CR tablet Take 650 mg by mouth every 8 (eight) hours as needed for pain.      aspirin EC 81 MG tablet Take 81 mg by mouth daily.     atorvastatin (LIPITOR) 40 MG tablet Take 40 mg by mouth at bedtime.     calcium carbonate (OSCAL) 1500 (600 Ca) MG TABS tablet Take 600 mg by mouth daily.      cephALEXin (KEFLEX) 500 MG capsule Take 1 capsule (500 mg total) by mouth 4 (four) times daily for 5 days. 20 capsule 0   Cholecalciferol (VITAMIN D-3) 25 MCG (1000 UT) CAPS Take 1,000 Units by mouth daily with breakfast.     CINNAMON PO Take 1,000 mg by mouth daily.      diphenhydrAMINE HCl (ZZZQUIL) 50 MG/30ML LIQD Take 50 mg by mouth at bedtime  as needed (sleep).      FLUoxetine (PROZAC) 10 MG tablet Take 30 mg by mouth daily.     insulin aspart (NOVOLOG) 100 UNIT/ML injection Inject 20 Units into the skin 3 (three) times daily with meals.      insulin glargine (LANTUS) 100 UNIT/ML injection Inject 50 Units into the skin at bedtime.      levothyroxine (SYNTHROID) 25 MCG tablet Take 25 mcg by mouth daily before breakfast.     lisinopril (ZESTRIL) 10 MG tablet Take 20 mg by mouth daily.     Multiple Vitamins-Minerals (ONE-A-DAY MENS 50+ ADVANTAGE) TABS Take 1 tablet by mouth daily with breakfast.     phenazopyridine (PYRIDIUM) 100 MG tablet Take 1 tablet (100 mg total) by mouth 3 (three) times daily for 2 days. 6 tablet 0   tamsulosin (FLOMAX) 0.4 MG CAPS capsule Take 0.4 mg by mouth daily.     atenolol (TENORMIN) 25 MG tablet Take 1 tablet (25 mg total) by mouth 2 (two) times daily. (Patient not taking: Reported on 10/21/2019)     levETIRAcetam (KEPPRA) 500 MG tablet Take 1 tablet (500 mg total) by mouth 2 (two) times daily. (Patient not taking: Reported on 03/22/2021) 60 tablet 4   ondansetron (ZOFRAN ODT) 4 MG disintegrating tablet Take 1 tablet (4 mg total) by mouth every 8 (eight) hours as needed for nausea or vomiting. (Patient not taking: Reported on  05/01/2022) 20 tablet 0   oxyCODONE-acetaminophen (PERCOCET) 5-325 MG tablet Take 1 tablet by mouth every 6 (six) hours as needed. (Patient not taking: Reported on 05/01/2022) 8 tablet 0    Lab Results:  Results for orders placed or performed during the hospital encounter of 04/30/22 (from the past 48 hour(s))  Basic metabolic panel     Status: Abnormal   Collection Time: 04/30/22 10:37 PM  Result Value Ref Range   Sodium 141 135 - 145 mmol/L   Potassium 3.8 3.5 - 5.1 mmol/L   Chloride 111 98 - 111 mmol/L   CO2 21 (L) 22 - 32 mmol/L   Glucose, Bld 158 (H) 70 - 99 mg/dL    Comment: Glucose reference range applies only to samples taken after fasting for at least 8 hours.   BUN 34 (H) 8 - 23 mg/dL   Creatinine, Ser 2.38 (H) 0.61 - 1.24 mg/dL   Calcium 9.7 8.9 - 10.3 mg/dL   GFR, Estimated 28 (L) >60 mL/min    Comment: (NOTE) Calculated using the CKD-EPI Creatinine Equation (2021)    Anion gap 9 5 - 15    Comment: Performed at [facility], [place] [address] [place],  [address]  CBC with Differential     Status: Abnormal   Collection Time: 04/30/22 10:37 PM  Result Value Ref Range   WBC 11.2 (H) 4.0 - 10.5 K/uL   RBC 4.63 4.22 - 5.81 MIL/uL   Hemoglobin 14.6 13.0 - 17.0 g/dL   HCT 43.5 39.0 - 52.0 %   MCV 94.0 80.0 - 100.0 fL   MCH 31.5 26.0 - 34.0 pg   MCHC 33.6 30.0 - 36.0 g/dL   RDW 12.9 11.5 - 15.5 %   Platelets 202 150 - 400 K/uL   nRBC 0.0 0.0 - 0.2 %   Neutrophils Relative % 73 %   Neutro Abs 8.0 (H) 1.7 - 7.7 K/uL   Lymphocytes Relative 18 %   Lymphs Abs 2.1 0.7 - 4.0 K/uL   Monocytes Relative 6 %   Monocytes Absolute 0.7  0.1 - 1.0 K/uL   Eosinophils Relative 3 %   Eosinophils Absolute 0.3 0.0 - 0.5 K/uL   Basophils Relative 0 %   Basophils Absolute 0.0 0.0 - 0.1 K/uL   Immature Granulocytes 0 %   Abs Immature Granulocytes 0.04 0.00 - 0.07 K/uL    Comment: Performed at [facility], [place] [address] [place], [place]  [address]  Urinalysis, Routine w reflex microscopic -Urine, Catheterized; Indwelling urinary catheter     Status: Abnormal   Collection Time: 05/01/22 12:03 AM  Result Value Ref Range   Color, Urine YELLOW YELLOW   APPearance CLOUDY (A) CLEAR   Specific Gravity, Urine 1.015 1.005 - 1.030   pH 5.0 5.0 - 8.0   Glucose, UA NEGATIVE NEGATIVE mg/dL   Hgb urine dipstick LARGE (A) NEGATIVE   Bilirubin Urine NEGATIVE NEGATIVE   Ketones, ur NEGATIVE NEGATIVE mg/dL   Protein, ur 30 (A) NEGATIVE mg/dL   Nitrite NEGATIVE NEGATIVE   Leukocytes,Ua MODERATE (A) NEGATIVE   RBC / HPF >50 0 - 5 RBC/hpf   WBC, UA >50 0 - 5 WBC/hpf   Bacteria, UA RARE (A) NONE SEEN   Squamous Epithelial / HPF 0-5 0 - 5 /HPF   WBC Clumps PRESENT    Mucus PRESENT     Comment: Performed at [facility], [place] [address] [place], [place] [address]  Urine Culture (for pregnant, neutropenic or urologic patients or patients with an indwelling urinary catheter)     Status: Abnormal (Preliminary result)   Collection Time: 05/01/22 12:36 AM   Specimen: Urine, Catheterized  Result Value Ref Range   Specimen Description      URINE, CATHETERIZED Performed at [facility] [address], [place], [place] [address]    Special Requests      NONE Performed at [facility], [place] [address] [place], [place] [address]    Culture (A)     >=100,000 COLONIES/mL STAPHYLOCOCCUS CAPITIS SUSCEPTIBILITIES TO FOLLOW Performed at [facility], [place] [address], [place], [place] [address]    Report Status PENDING   Resp panel by RT-PCR (RSV, Flu A&B, Covid) Anterior Nasal Swab     Status: None   Collection Time: 05/01/22  1:50 PM   Specimen: Anterior Nasal Swab  Result Value Ref Range   SARS Coronavirus 2 by RT PCR NEGATIVE NEGATIVE    Comment: (NOTE) SARS-CoV-2 target nucleic acids are NOT DETECTED.  The SARS-CoV-2 RNA is generally detectable in upper respiratory specimens  during the acute phase of infection. The lowest concentration of SARS-CoV-2 viral copies this assay can detect is 138 copies/mL. A negative result does not preclude SARS-Cov-2 infection and should not be used as the sole basis for treatment or other patient management decisions. A negative result may occur with  improper specimen collection/handling, submission of specimen other than nasopharyngeal swab, presence of viral mutation(s) within the areas targeted by this assay, and inadequate number of viral copies(<138 copies/mL). A negative result must be combined with clinical observations, patient history, and epidemiological information. The expected result is Negative.  Fact Sheet for Patients:  EntrepreneurPulse.com.au  Fact Sheet for Healthcare Providers:  IncredibleEmployment.be  This test is no t yet approved or cleared by the [place] FDA and  has been authorized for detection and/or diagnosis of SARS-CoV-2 by FDA under an Emergency Use Authorization (EUA). This EUA will remain  in effect (meaning this test can be used) for the duration of the COVID-19  declaration under Section 564(b)(1) of the Act, 21 U.S.C.section 360bbb-3(b)(1), unless the authorization is terminated  or revoked sooner.       Influenza A by PCR NEGATIVE NEGATIVE   Influenza B by PCR NEGATIVE NEGATIVE    Comment: (NOTE) The Xpert Xpress SARS-CoV-2/FLU/RSV plus assay is intended as an aid in the diagnosis of influenza from Nasopharyngeal swab specimens and should not be used as a sole basis for treatment. Nasal washings and aspirates are unacceptable for Xpert Xpress SARS-CoV-2/FLU/RSV testing.  Fact Sheet for Patients: EntrepreneurPulse.com.au  Fact Sheet for Healthcare Providers: IncredibleEmployment.be  This test is not yet approved or cleared by the [place] FDA and has been authorized for detection and/or diagnosis  of SARS-CoV-2 by FDA under an Emergency Use Authorization (EUA). This EUA will remain in effect (meaning this test can be used) for the duration of the COVID-19 declaration under Section 564(b)(1) of the Act, 21 U.S.C. section 360bbb-3(b)(1), unless the authorization is terminated or revoked.     Resp Syncytial Virus by PCR NEGATIVE NEGATIVE    Comment: (NOTE) Fact Sheet for Patients: EntrepreneurPulse.com.au  Fact Sheet for Healthcare Providers: IncredibleEmployment.be  This test is not yet approved or cleared by the [place] FDA and has been authorized for detection and/or diagnosis of SARS-CoV-2 by FDA under an Emergency Use Authorization (EUA). This EUA will remain in effect (meaning this test can be used) for the duration of the COVID-19 declaration under Section 564(b)(1) of the Act, 21 U.S.C. section 360bbb-3(b)(1), unless the authorization is terminated or revoked.  Performed at [facility], [place] [address], [place], [place] [address]     Blood Alcohol level:  Lab Results  Component Value Date   [facility] <10 04/27/2022   ETH <5 08/27/2014    Physical Findings:  CIWA:    COWS:     Musculoskeletal: Strength & Muscle Tone: within normal limits Gait & Station: normal Patient leans: N/A  Psychiatric Specialty Exam:  Presentation  General Appearance:  Appropriate for Environment  Eye Contact: Good  Speech: Clear and Coherent; Normal Rate  Speech Volume: Normal  Handedness: Right   Mood and Affect  Mood: Anxious; Depressed  Affect: Congruent   Thought Process  Thought Processes: Coherent; Goal Directed  Descriptions of Associations:Intact  Orientation:Full (Time, Place and Person)  Thought Content:Logical  History of Schizophrenia/Schizoaffective disorder:No  Duration of Psychotic Symptoms:No data recorded Hallucinations:Hallucinations: None  Ideas of  Reference:None  Suicidal Thoughts:Suicidal Thoughts: Yes, Active SI Active Intent and/or Plan: With Intent; With Plan; With Means to Carry Out  Homicidal Thoughts:Homicidal Thoughts: No   Sensorium  Memory: Immediate Good; Recent Good; Remote Good  Judgment: Fair  Insight: Fair   Community education officer  Concentration: Good  Attention Span: Good  Recall: Good  Fund of Knowledge: Good  Language: Good   Psychomotor Activity  Psychomotor Activity: Psychomotor Activity: Normal   Assets  Assets: Communication Skills; Desire for Improvement; Financial Resources/Insurance; Housing; Leisure Time; Social Support   Sleep  Sleep: Sleep: Good (Better)    Physical Exam: Physical Exam Vitals and nursing note reviewed. Exam conducted with a chaperone present.  Constitutional:      General: He is not in acute distress.    Appearance: Normal appearance. He is not ill-appearing.  HENT:     Head: Normocephalic.  Eyes:     Conjunctiva/sclera: Conjunctivae normal.  Cardiovascular:     Rate and Rhythm: Normal rate.  Pulmonary:     Effort: Pulmonary effort is normal. No respiratory distress.  Musculoskeletal:        General: Normal range of motion.  Skin:    General: Skin is warm and dry.  Neurological:     Mental Status: He is alert and oriented to person, place, and time.  Psychiatric:        Attention and Perception: Attention and perception normal. He does not perceive auditory or visual hallucinations.        Mood and Affect: Mood is anxious and depressed.        Speech: Speech normal.        Behavior: Behavior normal. Behavior is cooperative.        Thought Content: Thought content is not paranoid or delusional. Thought content includes suicidal ideation. Thought content does not include homicidal ideation. Thought content includes suicidal plan.        Cognition and Memory: Cognition normal.        Judgment: Judgment is impulsive.    Review of Systems   Genitourinary:        Urine catheter placement related to urinary retention due to enlarged prostate   Musculoskeletal:  Positive for joint pain and myalgias.  Psychiatric/Behavioral:  Positive for depression and suicidal ideas. Negative for hallucinations. The patient is nervous/anxious.   All other systems reviewed and are negative.  Blood pressure 123/70, pulse 74, temperature 98.1 F (36.7 C), temperature source Oral, resp. rate 17, height 5\' 7"  (1.702 m), weight 89.4 kg, SpO2 98 %. Body mass index is 30.85 kg/m.  Medical Decision Making: Patient meets criteria for inpatient psychiatric treatment  Problem 1: Patient with high risk factors for suicide completion.  Continue to recommend inpatient psychiatric treatment  Problem 2: Medication Management:  Continue Prozac 30 mg daily for depression and suicidal ideation  Problem 3: Patient continues to meet criteria for inpatient psychiatric treatment.  Social work to continue referral for appropriate bed for treatment  [employer], NP 05/02/2022, 1:26 PM

## 2022-05-02 NOTE — ED Provider Notes (Signed)
Emergency Medicine Observation Re-evaluation Note  Johnny [name] is a 72 y.o. male, seen on rounds today.  Pt initially presented to the ED for complaints of Hypotension, Dysuria, and Suicidal Currently, the patient is resting quietly.  Physical Exam  BP 123/70 (BP Location: Left Arm)   Pulse 74   Temp 98.1 F (36.7 C) (Oral)   Resp 17   Ht 5\' 7"  (1.702 m)   Wt 89.4 kg   SpO2 98%   BMI 30.85 kg/m  Physical Exam General: No acute distress Cardiac: Well-perfused Lungs: Nonlabored Psych: Calm  ED Course / MDM  EKG:EKG Interpretation  Date/Time:  Sunday April 30 2022 21:17:13 EDT Ventricular Rate:  88 PR Interval:  159 QRS Duration: 77 QT Interval:  360 QTC Calculation: 436 R Axis:   35 Text Interpretation: Sinus rhythm No previous ECGs available Confirmed by Gareth Morgan [id number]) on 05/01/2022 2:59:25 PM  I have reviewed the labs performed to date as well as medications administered while in observation.  Recent changes in the last 24 hours include psychiatry evaluation.  Plan  Current plan is for [facility] psych placement, restarting Prozac.    Hayden Rasmussen, MD 05/02/22 [phone]

## 2022-05-02 NOTE — ED Notes (Signed)
Psych NP in room

## 2022-05-02 NOTE — Progress Notes (Signed)
LCSW Progress Note  MR:[id number]   KASHAWN LAUREN  05/02/2022  10:44 AM  Description:   Inpatient Psychiatric Referral  Patient was recommended inpatient per Earleen Newport, NP. Pt was denied by [facility] due to indwelling catheter. Indwelling catheter may be barrier for inpatient treatment. Patient was referred to the following med-psych facilities:   Destination  Service Provider Address Phone [facility]  [address] [place] [address] [phone]  [facility]  [address] [place] [address], [place] [address] [phone] [facility]  [phone] [address] [place] [address] [phone] [facility]  [address] [place], [place] [address] [phone] [place] [address] [place]  [facility]  [address], [place] [address] [phone]  [place] [address] [place] [address] [phone]  [facility]  [address] [place] [address] [phone] [facility]  [address], [place] [address] [phone]  [facility]  [address], [place] [address] [place]  [facility]  [address], [place] [address] [phone] [place]  [place] [address] [phone]  [facility]  [address] [place] [address] [phone]  [address] [place] [address] [phone]  [facility]  [place], [place] [address] [phone] [facility]  [place], [place] [address] [phone] [facility]  [address] [place] [address] [id number]  [facility] (after hours)  [address], [place] [address] [id number]    Situation ongoing, CSW to continue following and update chart as more information becomes available.      Denna Haggard, Latanya Presser  05/02/2022 10:44 AM

## 2022-05-02 NOTE — Progress Notes (Addendum)
ADDENDUM - Pt is not accepted to [facility] due to catheter  12:45 PM - CSW received phone call from April, Scientist, research (life sciences), at [facility]. April reports that after further discussion with their provider, [facility] under specific [facility] regarding his catheter. April reports [facility] staff will take out pt's current catheter when he arrives at [place]. Then pt will have to insert his own self-catheter 2 times daily. CSW made nursing staff aware of [facility] criteria. CSW asked nursing staff to discuss this with pt. Pt reports he is not willing to complete self-catheter. CSW called [facility] back to inform them of pt's decision. Pt is not accepted to [facility] due to catheter.    10:10 AM - CSW received phone call from April, Scientist, research (life sciences), at [facility]. April reports pt has been denied by [facility] due to indwelling catheter. April advised CSW that indwelling catheter is exclusionary criteria for all [facility] for inpatient psychiatric treatment. CSW will continue to assist and follow with placement.  Denna Haggard, [place]  05/02/2022 10:15 AM

## 2022-05-02 NOTE — ED Notes (Signed)
Notified by SW that patient is denied at the [place] d/t foley cath

## 2022-05-02 NOTE — ED Notes (Signed)
Pt in bed, pt calm and cooperative, reviewed plan of care, pt denies si or hi at this time, states that he can stay safe in the ed.  Resps even and unlabored, pt denies pain. Pt has no requests.  Report to TCU rn

## 2022-05-02 NOTE — Progress Notes (Signed)
Pt is under review at [facility] for inpatient treatment. This CSW sent completed and signed [id number] transfer form to daytime fax [phone]. CSW will await phone call from [facility] transfer coordinator, April, regarding pt status. CSW will continue to assist and follow with placement.  Denna Haggard, [place]  05/02/2022 8:24 AM

## 2022-05-03 LAB — CBG MONITORING, ED
Glucose-Capillary: 139 mg/dL — ABNORMAL HIGH (ref 70–99)
Glucose-Capillary: 211 mg/dL — ABNORMAL HIGH (ref 70–99)
Glucose-Capillary: 203 mg/dL — ABNORMAL HIGH (ref 70–99)
Glucose-Capillary: 267 mg/dL — ABNORMAL HIGH (ref 70–99)

## 2022-05-03 LAB — URINE CULTURE: Culture: >=100000 — AB

## 2022-05-03 NOTE — Progress Notes (Addendum)
[facility] Psych ED Progress Note  05/03/2022 12:19 PM JEREMAIAH DENNIS  MRN:  [id number]  Subjective: I feel a little better but still have the pain."    HPI:  COURTLIN COCKER seen face to face by this provider, consulted with Dr. Hampton Abbot; and chart reviewed on 05/03/22.  On evaluation he reports he continues to have suicidal ideation related to the pain from the indwelling catheter stating if he is discharged home with the cath he can't contract for safety.  Asked if the cath was removed if felt he would be safe at home "I don't know depends on how the pain is."  He denies homicidal ideation, psychosis, and paranoia.     During evaluation MAXSTON KASIM is lying in bed with no noted distress.  He is alert/oriented x 4, calm, cooperative, attentive, and responses were relevant and appropriate to assessment questions.  He spoke in a clear tone at moderate volume, and normal pace, with good eye contact.   He denies homicidal ideation, psychosis, and paranoia.  Objectively:  there is no evidence of psychosis/mania or delusional thinking.  He conversed coherently, with goal directed thoughts, and no distractibility, or pre-occupation.  Will continue to recommend inpatient psychiatric treatment.  Will also speak to EDP to see if there is a possibility of having the urine catheter removed an assessing patient urine output to see if it is a option for patient to be discharged home without it.     Principal Problem: Adjustment disorder with mixed anxiety and depressed mood Diagnosis:  Principal Problem:   Adjustment disorder with depressed mood Active Problems:   Suicidal ideations   ED Assessment Time Calculation: Start Time: 1200 Stop Time: 1220 Total Time in Minutes (Assessment Completion): 20   Past Psychiatric History: Depression, Anxiety  [place] Scale:  [place] ED from 04/30/2022 in [facility] Emergency Department at [facility] ED from 04/27/2022 in [facility] Emergency  Department at [facility] Admission (Discharged) from 10/01/2019 in [facility] 4E CV SURGICAL PROGRESSIVE CARE  C-SSRS RISK CATEGORY High Risk High Risk High Risk       Past Medical History:  Past Medical History:  Diagnosis Date   Anxiety    Chronic kidney disease    Depression    Diabetes mellitus without complication (HCC)    Type 1   Hyperlipemia    Hypertension    Hypothyroidism    SDH (subdural hematoma) ([place]) 07/26/2018   from fall   Sleep apnea     Past Surgical History:  Procedure Laterality Date   APPENDECTOMY     ENDARTERECTOMY Right 10/01/2019   Procedure: ENDARTERECTOMY CAROTID; PATCH ANGIOPLASTY;  Surgeon: Marty Heck, MD;  Location: [facility] OR;  Service: Vascular;  Laterality: Right;   EYE SURGERY     PATCH ANGIOPLASTY Right 10/01/2019   Procedure: PATCH ANGIOPLASTY USING [place];  Surgeon: Marty Heck, MD;  Location: [facility] OR;  Service: Vascular;  Laterality: Right;   Family History:  Family History  Family history unknown: Yes   Family Psychiatric  History: None reported Social History:  Social History   Substance and Sexual Activity  Alcohol Use No     Social History   Substance and Sexual Activity  Drug Use No    Social History   Socioeconomic History   Marital status: Single    Spouse name: Not on file   Number of children: Not on file   Years of education: Not on file  Highest education level: Not on file  Occupational History   Not on file  Tobacco Use   Smoking status: Every Day    Types: Pipe   Smokeless tobacco: Never  Vaping Use   Vaping Use: Never used  Substance and Sexual Activity   Alcohol use: No   Drug use: No   Sexual activity: Not on file  Other Topics Concern   Not on file  Social History Narrative   Not on file   Social Determinants of Health   Financial Resource Strain: Not on file  Food Insecurity: Not on file  Transportation Needs: Not on file  Physical Activity: Not  on file  Stress: Not on file  Social Connections: Not on file    Sleep: Good, Improved  Appetite:  Good  Current Medications: Current Facility-Administered Medications  Medication Dose Route Frequency Provider Last Rate Last Admin   acetaminophen (TYLENOL) tablet 650 mg  650 mg Oral Q8H PRN Fransico Meadow, MD       aspirin EC tablet 81 mg  81 mg Oral Daily Fransico Meadow, MD   81 mg at 05/03/22 [id number]   atorvastatin (LIPITOR) tablet 40 mg  40 mg Oral QHS Fransico Meadow, MD   40 mg at 05/02/22 2204   calcium carbonate (OS-CAL - dosed in mg of elemental calcium) tablet 1,250 mg  1 tablet Oral QAC breakfast Fransico Meadow, MD   1,250 mg at 05/03/22 [id number]   cephALEXin (KEFLEX) capsule 500 mg  500 mg Oral Q8H Regan Lemming, MD   500 mg at 05/03/22 0601   cholecalciferol (VITAMIN D3) 25 MCG (1000 UNIT) tablet 1,000 Units  1,000 Units Oral Q breakfast Fransico Meadow, MD   1,000 Units at 05/03/22 [id number]   diphenhydrAMINE (BENADRYL) capsule 50 mg  50 mg Oral QHS PRN Fransico Meadow, MD   50 mg at 05/02/22 2204   finasteride (PROSCAR) tablet 5 mg  5 mg Oral Daily Fransico Meadow, MD   5 mg at 05/03/22 [id number]   FLUoxetine (PROZAC) capsule 30 mg  30 mg Oral Daily Fransico Meadow, MD   30 mg at 05/03/22 0921   insulin aspart (novoLOG) injection 0-15 Units  0-15 Units Subcutaneous TID WC Fransico Meadow, MD   2 Units at 05/03/22 0824   insulin glargine-yfgn (SEMGLEE) injection 50 Units  50 Units Subcutaneous QHS Fransico Meadow, MD   50 Units at 05/02/22 2205   levothyroxine (SYNTHROID) tablet 25 mcg  25 mcg Oral Q0600 System, Provider Not In   25 mcg at 05/03/22 0600   lisinopril (ZESTRIL) tablet 5 mg  5 mg Oral Daily Fransico Meadow, MD   5 mg at 05/03/22 [id number]   multivitamin with minerals tablet 1 tablet  1 tablet Oral Q breakfast Fransico Meadow, MD   1 tablet at 05/03/22 [id number]   tamsulosin (FLOMAX) capsule 0.4 mg  0.4 mg Oral Daily Fransico Meadow, MD   0.4 mg at  05/03/22 [id number]   Current Outpatient Medications  Medication Sig Dispense Refill   acetaminophen (TYLENOL) 650 MG CR tablet Take 650 mg by mouth every 8 (eight) hours as needed for pain.      aspirin EC 81 MG tablet Take 81 mg by mouth daily.     atorvastatin (LIPITOR) 40 MG tablet Take 40 mg by mouth at bedtime.     calcium carbonate (OSCAL) 1500 (600 Ca) MG TABS tablet Take 600 mg by mouth daily.  cephALEXin (KEFLEX) 500 MG capsule Take 1 capsule (500 mg total) by mouth 4 (four) times daily for 5 days. 20 capsule 0   Cholecalciferol (VITAMIN D-3) 25 MCG (1000 UT) CAPS Take 1,000 Units by mouth daily with breakfast.     CINNAMON PO Take 1,000 mg by mouth daily.      diphenhydrAMINE HCl (ZZZQUIL) 50 MG/30ML LIQD Take 50 mg by mouth at bedtime as needed (sleep).      finasteride (PROSCAR) 5 MG tablet Take 5 mg by mouth daily.     FLUoxetine (PROZAC) 10 MG tablet Take 30 mg by mouth daily.     insulin aspart (NOVOLOG) 100 UNIT/ML injection Inject 20 Units into the skin 3 (three) times daily with meals.      insulin glargine (LANTUS) 100 UNIT/ML injection Inject 50 Units into the skin at bedtime.      levothyroxine (SYNTHROID) 25 MCG tablet Take 25 mcg by mouth daily before breakfast.     lisinopril (ZESTRIL) 5 MG tablet Take 5 mg by mouth daily.     Multiple Vitamins-Minerals (ONE-A-DAY MENS 50+ ADVANTAGE) TABS Take 1 tablet by mouth daily with breakfast.     phenazopyridine (PYRIDIUM) 100 MG tablet Take 1 tablet (100 mg total) by mouth 3 (three) times daily for 2 days. 6 tablet 0   tamsulosin (FLOMAX) 0.4 MG CAPS capsule Take 0.4 mg by mouth daily.      Lab Results:  Results for orders placed or performed during the hospital encounter of 04/30/22 (from the past 48 hour(s))  Resp panel by RT-PCR (RSV, Flu A&B, Covid) Anterior Nasal Swab     Status: None   Collection Time: 05/01/22  1:50 PM   Specimen: Anterior Nasal Swab  Result Value Ref Range   SARS Coronavirus 2 by RT PCR NEGATIVE  NEGATIVE    Comment: (NOTE) SARS-CoV-2 target nucleic acids are NOT DETECTED.  The SARS-CoV-2 RNA is generally detectable in upper respiratory specimens during the acute phase of infection. The lowest concentration of SARS-CoV-2 viral copies this assay can detect is 138 copies/mL. A negative result does not preclude SARS-Cov-2 infection and should not be used as the sole basis for treatment or other patient management decisions. A negative result may occur with  improper specimen collection/handling, submission of specimen other than nasopharyngeal swab, presence of viral mutation(s) within the areas targeted by this assay, and inadequate number of viral copies(<138 copies/mL). A negative result must be combined with clinical observations, patient history, and epidemiological information. The expected result is Negative.  Fact Sheet for Patients:  EntrepreneurPulse.com.au  Fact Sheet for Healthcare Providers:  IncredibleEmployment.be  This test is no t yet approved or cleared by the [place] FDA and  has been authorized for detection and/or diagnosis of SARS-CoV-2 by FDA under an Emergency Use Authorization (EUA). This EUA will remain  in effect (meaning this test can be used) for the duration of the COVID-19 declaration under Section 564(b)(1) of the Act, 21 U.S.C.section 360bbb-3(b)(1), unless the authorization is terminated  or revoked sooner.       Influenza A by PCR NEGATIVE NEGATIVE   Influenza B by PCR NEGATIVE NEGATIVE    Comment: (NOTE) The Xpert Xpress SARS-CoV-2/FLU/RSV plus assay is intended as an aid in the diagnosis of influenza from Nasopharyngeal swab specimens and should not be used as a sole basis for treatment. Nasal washings and aspirates are unacceptable for Xpert Xpress SARS-CoV-2/FLU/RSV testing.  Fact Sheet for Patients: EntrepreneurPulse.com.au  Fact Sheet for Healthcare  Providers:  IncredibleEmployment.be  This test is not yet approved or cleared by the [place] and has been authorized for detection and/or diagnosis of SARS-CoV-2 by FDA under an Emergency Use Authorization (EUA). This EUA will remain in effect (meaning this test can be used) for the duration of the COVID-19 declaration under Section 564(b)(1) of the Act, 21 U.S.C. section 360bbb-3(b)(1), unless the authorization is terminated or revoked.     Resp Syncytial Virus by PCR NEGATIVE NEGATIVE    Comment: (NOTE) Fact Sheet for Patients: EntrepreneurPulse.com.au  Fact Sheet for Healthcare Providers: IncredibleEmployment.be  This test is not yet approved or cleared by the [place] FDA and has been authorized for detection and/or diagnosis of SARS-CoV-2 by FDA under an Emergency Use Authorization (EUA). This EUA will remain in effect (meaning this test can be used) for the duration of the COVID-19 declaration under Section 564(b)(1) of the Act, 21 U.S.C. section 360bbb-3(b)(1), unless the authorization is terminated or revoked.  Performed at [facility], [place] [address], [place], [place] [address]   POC CBG, ED     Status: Abnormal   Collection Time: 05/02/22  4:37 PM  Result Value Ref Range   Glucose-Capillary 270 (H) 70 - 99 mg/dL    Comment: Glucose reference range applies only to samples taken after fasting for at least 8 hours.  CBG monitoring, ED     Status: Abnormal   Collection Time: 05/02/22  9:48 PM  Result Value Ref Range   Glucose-Capillary 221 (H) 70 - 99 mg/dL    Comment: Glucose reference range applies only to samples taken after fasting for at least 8 hours.  CBG monitoring, ED     Status: Abnormal   Collection Time: 05/03/22  8:19 AM  Result Value Ref Range   Glucose-Capillary 139 (H) 70 - 99 mg/dL    Comment: Glucose reference range applies only to samples taken after fasting for  at least 8 hours.    Blood Alcohol level:  Lab Results  Component Value Date   ETH <10 04/27/2022   ETH <5 08/27/2014    Physical Findings:  CIWA:    COWS:     Musculoskeletal: Strength & Muscle Tone: within normal limits Gait & Station: normal Patient leans: N/A  Psychiatric Specialty Exam:  Presentation  General Appearance:  Appropriate for Environment  Eye Contact: Good  Speech: Normal Rate  Speech Volume: Normal  Handedness: Right   Mood and Affect  Mood: Dysphoric  Affect: Congruent   Thought Process  Thought Processes: Coherent; Goal Directed  Descriptions of Associations:Intact  Orientation:Full (Time, Place and Person)  Thought Content:Logical  History of Schizophrenia/Schizoaffective disorder:No  Duration of Psychotic Symptoms:No data recorded Hallucinations:Hallucinations: None  Ideas of Reference:None  Suicidal Thoughts:Suicidal Thoughts: Yes, Passive SI Active Intent and/or Plan: With Intent; With Plan; With Means to Carry Out  Homicidal Thoughts:Homicidal Thoughts: No   Sensorium  Memory: Immediate Good; Recent Good; Remote Good  Judgment: Fair  Insight: Fair; Present   Executive Functions  Concentration: Good  Attention Span: Good  Recall: Good  Fund of Knowledge: Good  Language: Good   Psychomotor Activity  Psychomotor Activity: Psychomotor Activity: Normal   Assets  Assets: Communication Skills; Desire for Improvement; Financial Resources/Insurance; Housing; Resilience; Social Support; Leisure Time   Sleep  Sleep: Sleep: Good    Physical Exam: Physical Exam Vitals and nursing note reviewed. Exam conducted with a chaperone present.  Constitutional:      General: He is not in acute distress.  Appearance: Normal appearance. He is not ill-appearing.  HENT:     Head: Normocephalic.  Eyes:     Conjunctiva/sclera: Conjunctivae normal.  Cardiovascular:     Rate and Rhythm: Normal  rate.  Pulmonary:     Effort: Pulmonary effort is normal. No respiratory distress.  Musculoskeletal:        General: Normal range of motion.  Skin:    General: Skin is warm and dry.  Neurological:     Mental Status: He is alert and oriented to person, place, and time.  Psychiatric:        Attention and Perception: Attention and perception normal. He does not perceive auditory or visual hallucinations.        Mood and Affect: Mood is anxious and depressed.        Speech: Speech normal.        Behavior: Behavior normal. Behavior is cooperative.        Thought Content: Thought content is not paranoid or delusional. Thought content includes suicidal ideation. Thought content does not include homicidal ideation. Thought content includes suicidal plan.        Cognition and Memory: Cognition normal.        Judgment: Judgment is impulsive.    Review of Systems  Genitourinary:        Urine catheter placement related to urinary retention due to enlarged prostate   Musculoskeletal:  Positive for joint pain and myalgias.  Psychiatric/Behavioral:  Positive for depression and suicidal ideas. Negative for hallucinations. The patient is nervous/anxious.   All other systems reviewed and are negative.  Blood pressure 106/66, pulse 73, temperature 98.2 F (36.8 C), temperature source Oral, resp. rate 16, height 5\' 7"  (1.702 m), weight 89.4 kg, SpO2 95 %. Body mass index is 30.85 kg/m.  Medical Decision Making: Patient meets criteria for inpatient psychiatric treatment  Problem 1: Patient with high risk factors for suicide completion.  Continue to recommend inpatient psychiatric treatment  Problem 2: Medication Management:  Continue Prozac 30 mg daily for depression and suicidal ideation  Problem 3: Patient continues to meet criteria for inpatient psychiatric treatment.  Social work to continue referral for appropriate bed for treatment  Spoke with Dr. Ronnald Nian related to patients complaints of  urine catheter:   Patient stating that the pain from his urine catheter is what is causing him to be suicidal, not able to contract for safety, lives alone, so high risk for suicide attempt. The catheter placed because he was having urinary retention related to enlarge prostate. Cath is supposed to be removed 30 day from insertion. Is there any way the Cath can be removed and we monitor the urine output to see if he can go home with out it.  Will remove catheter today and monitor urine output throughout today and tomorrow to determine if has to be replaced.    Melia Hopes, NP 05/03/2022, 12:19 PM

## 2022-05-03 NOTE — Progress Notes (Signed)
LCSW Progress Note  [id number]   JUDITH KEMPE  05/03/2022  12:46 PM  Description:   Inpatient Psychiatric Referral  Patient was recommended inpatient per Earleen Newport, NP. Pt continues to need med-psych facility placement due to indwelling catheter. Patient was referred to the following med-psych facilities:   Destination  Service Provider Address Phone [facility]  [address] [place] [address] [phone]  [facility]  [address], [place] [address] [phone] [facility]  [phone] [place] [address] [phone] [facility]  [address] [place], [place] [address] [phone] [place] [address] [place] [address] [place]  [facility]  [address], [place] [address] [phone]  [address] [place] [address] [phone]  [facility]  [address], [place] [address] [phone] [facility]  [address], [place] [address] [phone]  [facility]  [address], [place] [address] [place]  [facility]  [address], [place] [address] [phone] [place]  [place] [address] [id number]  [facility]  [address], [place] [address] [phone] [place] [place] [address] [phone] [facility]  [place], [place] [address] [phone] [facility]  [place], [place] [address] [id number]    Situation ongoing, CSW to continue following and update chart as more information becomes available.      Herbie Baltimore  05/03/2022 12:46 PM

## 2022-05-03 NOTE — ED Provider Notes (Addendum)
Emergency Medicine Observation Re-evaluation Note  Johnny [name] is a 72 y.o. male, seen on rounds today.  Pt initially presented to the ED for complaints of Hypotension, Dysuria, and Suicidal Currently, the patient is resting.  Physical Exam  BP 106/66 (BP Location: Right Arm)   Pulse 73   Temp 98.2 F (36.8 C) (Oral)   Resp 16   Ht 5\' 7"  (1.702 m)   Wt 89.4 kg   SpO2 95%   BMI 30.85 kg/m  Physical Exam Neurological:     Mental Status: He is alert.     EKG:EKG Interpretation  Date/Time:  Sunday April 30 2022 21:17:13 EDT Ventricular Rate:  88 PR Interval:  159 QRS Duration: 77 QT Interval:  360 QTC Calculation: 436 R Axis:   35 Text Interpretation: Sinus rhythm No previous ECGs available Confirmed by Gareth Morgan [phone]) on 05/01/2022 2:59:25 PM  I have reviewed the labs performed to date as well as medications administered while in observation.  Recent changes in the last 24 hours include nothing.  Plan  Current plan is for in pt.  After talking with psychiatry team.  Placement has been difficult as patient has a urine catheter.  He had this placed due to BPH issues a while back.  Will remove Foley catheter and give him a trial of void.    Lennice Sites, DO 05/03/22 0720    Lennice Sites, DO 05/03/22 1235

## 2022-05-04 DIAGNOSIS — F4323 Adjustment disorder with mixed anxiety and depressed mood: Secondary | ICD-10-CM

## 2022-05-04 LAB — CBG MONITORING, ED
Glucose-Capillary: 159 mg/dL — ABNORMAL HIGH (ref 70–99)
Glucose-Capillary: 188 mg/dL — ABNORMAL HIGH (ref 70–99)

## 2022-05-04 MED ORDER — CEPHALEXIN 500 MG PO CAPS: 500.0000 mg | ORAL_CAPSULE | Freq: Two times a day (BID) | ORAL | 0 refills | Status: AC

## 2022-05-04 NOTE — Progress Notes (Signed)
[facility] Psych ED Progress Note  05/04/2022 1:39 PM Johnny [name]  MRN:  [id number]   Principal Problem: Adjustment disorder with mixed anxiety and depressed mood Diagnosis:  Principal Problem:   Adjustment disorder with depressed mood Active Problems:   Suicidal ideations   ED Assessment Time Calculation: Start Time: 1020 Stop Time: 1035 Total Time in Minutes (Assessment Completion): 15   Subjective:  Johnny [name] seen face to face by this provider, consulted with Dr. Hampton Abbot; and chart reviewed on 05/04/22.  On evaluation he reports he feels much better.  Upon entering the room patient's niece Webb Silversmith [phone]), home he lives with is in the room with him.  Provider asked patient if he was okay to speak in front of his niece, he stated it was.  Patient states he is feeling better and he is ready to be discharged.  Patient denies SI/HI/AVH, stating his appetite and sleep are good, says he feels much better since the catheter has been taken out.  Patient states that he is going to live back with his niece, who is very supportive and takes good care of him.  Patient is laughing and joking with provider and niece, mood is pleasant and euthymic with a bright affect.  Patient states he is able to contract for safety, said that he still has urinary retention and there is a possibility he may have to go on dialysis which he is not looking forward to it but he will deal with that, and niece agrees that she will help him get through it.  During evaluation Johnny [name] is lying in bed with no noted distress.  He is alert/oriented x 4, calm, cooperative, attentive, and responses were relevant and appropriate to assessment questions. He spoke in a clear tone at moderate volume, and normal pace, with good eye contact.   He denies suicidal, and homicidal ideation, psychosis, and paranoia.  Objectively:  there is no evidence of psychosis/mania or delusional thinking.  He conversed coherently, with goal  directed thoughts, and no distractibility, or pre-occupation.    Past Psychiatric History: Depression, anxiety  [place] Scale:  [facility] ED from 04/30/2022 in [facility] Emergency Department at [facility] ED from 04/27/2022 in [facility] Emergency Department at [facility] Admission (Discharged) from 10/01/2019 in [facility] 4E CV SURGICAL PROGRESSIVE CARE  C-SSRS RISK CATEGORY High Risk High Risk High Risk       Past Medical History:  Past Medical History:  Diagnosis Date   Anxiety    Chronic kidney disease    Depression    Diabetes mellitus without complication ([place])    Type 1   Hyperlipemia    Hypertension    Hypothyroidism    SDH (subdural hematoma) ([place]) 07/26/2018   from fall   Sleep apnea     Past Surgical History:  Procedure Laterality Date   APPENDECTOMY     ENDARTERECTOMY Right 10/01/2019   Procedure: ENDARTERECTOMY CAROTID; PATCH ANGIOPLASTY;  Surgeon: Marty Heck, MD;  Location: [facility];  Service: Vascular;  Laterality: Right;   EYE SURGERY     PATCH ANGIOPLASTY Right 10/01/2019   Procedure: PATCH ANGIOPLASTY USING [place];  Surgeon: Marty Heck, MD;  Location: [facility] OR;  Service: Vascular;  Laterality: Right;   Family History:  Family History  Family history unknown: Yes    Social History:  Social History   Substance and Sexual Activity  Alcohol Use No     Social History  Substance and Sexual Activity  Drug Use No    Social History   Socioeconomic History   Marital status: Single    Spouse name: Not on file   Number of children: Not on file   Years of education: Not on file   Highest education level: Not on file  Occupational History   Not on file  Tobacco Use   Smoking status: Every Day    Types: Pipe   Smokeless tobacco: Never  Vaping Use   Vaping Use: Never used  Substance and Sexual Activity   Alcohol use: No   Drug use: No   Sexual activity: Not on file  Other Topics Concern    Not on file  Social History Narrative   Not on file   Social Determinants of Health   Financial Resource Strain: Not on file  Food Insecurity: Not on file  Transportation Needs: Not on file  Physical Activity: Not on file  Stress: Not on file  Social Connections: Not on file    Sleep: Good  Appetite:  Good  Current Medications: Current Facility-Administered Medications  Medication Dose Route Frequency Provider Last Rate Last Admin   acetaminophen (TYLENOL) tablet 650 mg  650 mg Oral Q8H PRN Fransico Meadow, MD       aspirin EC tablet 81 mg  81 mg Oral Daily Fransico Meadow, MD   81 mg at 05/04/22 0914   atorvastatin (LIPITOR) tablet 40 mg  40 mg Oral QHS Fransico Meadow, MD   40 mg at 05/03/22 2109   calcium carbonate (OS-CAL - dosed in mg of elemental calcium) tablet 1,250 mg  1 tablet Oral QAC breakfast Fransico Meadow, MD   1,250 mg at 05/04/22 0915   cephALEXin (KEFLEX) capsule 500 mg  500 mg Oral Q8H Regan Lemming, MD   500 mg at 05/04/22 [id number]   cholecalciferol (VITAMIN D3) 25 MCG (1000 UNIT) tablet 1,000 Units  1,000 Units Oral Q breakfast Fransico Meadow, MD   1,000 Units at 05/04/22 0913   diphenhydrAMINE (BENADRYL) capsule 50 mg  50 mg Oral QHS PRN Fransico Meadow, MD   50 mg at 05/02/22 2204   finasteride (PROSCAR) tablet 5 mg  5 mg Oral Daily Fransico Meadow, MD   5 mg at 05/04/22 0914   FLUoxetine (PROZAC) capsule 30 mg  30 mg Oral Daily Fransico Meadow, MD   30 mg at 05/04/22 0913   insulin aspart (novoLOG) injection 0-15 Units  0-15 Units Subcutaneous TID WC Fransico Meadow, MD   3 Units at 05/04/22 1258   insulin glargine-yfgn (SEMGLEE) injection 50 Units  50 Units Subcutaneous QHS Fransico Meadow, MD   50 Units at 05/03/22 2108   levothyroxine (SYNTHROID) tablet 25 mcg  25 mcg Oral Q0600 System, Provider Not In   25 mcg at 05/04/22 0657   multivitamin with minerals tablet 1 tablet  1 tablet Oral Q breakfast Fransico Meadow, MD   1  tablet at 05/04/22 0913   tamsulosin (FLOMAX) capsule 0.4 mg  0.4 mg Oral Daily Fransico Meadow, MD   0.4 mg at 05/04/22 [id number]   Current Outpatient Medications  Medication Sig Dispense Refill   acetaminophen (TYLENOL) 650 MG CR tablet Take 650 mg by mouth every 8 (eight) hours as needed for pain.      aspirin EC 81 MG tablet Take 81 mg by mouth daily.     atorvastatin (LIPITOR) 40 MG tablet Take 40 mg by  mouth at bedtime.     calcium carbonate (OSCAL) 1500 (600 Ca) MG TABS tablet Take 600 mg by mouth daily.      cephALEXin (KEFLEX) 500 MG capsule Take 1 capsule (500 mg total) by mouth 4 (four) times daily for 5 days. 20 capsule 0   Cholecalciferol (VITAMIN D-3) 25 MCG (1000 UT) CAPS Take 1,000 Units by mouth daily with breakfast.     CINNAMON PO Take 1,000 mg by mouth daily.      diphenhydrAMINE HCl (ZZZQUIL) 50 MG/30ML LIQD Take 50 mg by mouth at bedtime as needed (sleep).      finasteride (PROSCAR) 5 MG tablet Take 5 mg by mouth daily.     FLUoxetine (PROZAC) 10 MG tablet Take 30 mg by mouth daily.     insulin aspart (NOVOLOG) 100 UNIT/ML injection Inject 20 Units into the skin 3 (three) times daily with meals.      insulin glargine (LANTUS) 100 UNIT/ML injection Inject 50 Units into the skin at bedtime.      levothyroxine (SYNTHROID) 25 MCG tablet Take 25 mcg by mouth daily before breakfast.     lisinopril (ZESTRIL) 5 MG tablet Take 5 mg by mouth daily.     Multiple Vitamins-Minerals (ONE-A-DAY MENS 50+ ADVANTAGE) TABS Take 1 tablet by mouth daily with breakfast.     tamsulosin (FLOMAX) 0.4 MG CAPS capsule Take 0.4 mg by mouth daily.      Lab Results:  Results for orders placed or performed during the hospital encounter of 04/30/22 (from the past 48 hour(s))  POC CBG, ED     Status: Abnormal   Collection Time: 05/02/22  4:37 PM  Result Value Ref Range   Glucose-Capillary 270 (H) 70 - 99 mg/dL    Comment: Glucose reference range applies only to samples taken after fasting for at  least 8 hours.  CBG monitoring, ED     Status: Abnormal   Collection Time: 05/02/22  9:48 PM  Result Value Ref Range   Glucose-Capillary 221 (H) 70 - 99 mg/dL    Comment: Glucose reference range applies only to samples taken after fasting for at least 8 hours.  CBG monitoring, ED     Status: Abnormal   Collection Time: 05/03/22  8:19 AM  Result Value Ref Range   Glucose-Capillary 139 (H) 70 - 99 mg/dL    Comment: Glucose reference range applies only to samples taken after fasting for at least 8 hours.  CBG monitoring, ED     Status: Abnormal   Collection Time: 05/03/22 12:27 PM  Result Value Ref Range   Glucose-Capillary 211 (H) 70 - 99 mg/dL    Comment: Glucose reference range applies only to samples taken after fasting for at least 8 hours.  CBG monitoring, ED     Status: Abnormal   Collection Time: 05/03/22  5:18 PM  Result Value Ref Range   Glucose-Capillary 203 (H) 70 - 99 mg/dL    Comment: Glucose reference range applies only to samples taken after fasting for at least 8 hours.  CBG monitoring, ED     Status: Abnormal   Collection Time: 05/03/22  9:14 PM  Result Value Ref Range   Glucose-Capillary 267 (H) 70 - 99 mg/dL    Comment: Glucose reference range applies only to samples taken after fasting for at least 8 hours.  CBG monitoring, ED     Status: Abnormal   Collection Time: 05/04/22  8:06 AM  Result Value Ref Range   Glucose-Capillary 159 (H)  70 - 99 mg/dL    Comment: Glucose reference range applies only to samples taken after fasting for at least 8 hours.  CBG monitoring, ED     Status: Abnormal   Collection Time: 05/04/22 11:50 AM  Result Value Ref Range   Glucose-Capillary 188 (H) 70 - 99 mg/dL    Comment: Glucose reference range applies only to samples taken after fasting for at least 8 hours.    Blood Alcohol level:  Lab Results  Component Value Date   [facility] <10 04/27/2022   ETH <5 08/27/2014    Physical Findings:  CIWA:    COWS:      Musculoskeletal:  Patient observed resting in his bed.   Psychiatric Specialty Exam:  Presentation  General Appearance:  Appropriate for Environment  Eye Contact: Good  Speech: Clear and Coherent  Speech Volume: Normal  Handedness: Right   Mood and Affect  Mood: Euthymic  Affect: Congruent   Thought Process  Thought Processes: Coherent  Descriptions of Associations:Intact  Orientation:Full (Time, Place and Person)  Thought Content:Logical  History of Schizophrenia/Schizoaffective disorder:No  Duration of Psychotic Symptoms:No data recorded Hallucinations:Hallucinations: None  Ideas of Reference:None  Suicidal Thoughts:Suicidal Thoughts: No SI Active Intent and/or Plan: With Intent; With Plan; With Means to Carry Out  Homicidal Thoughts:Homicidal Thoughts: No   Sensorium  Memory: Immediate Good; Recent Good  Judgment: Fair  Insight: Fair   Community education officer  Concentration: Good  Attention Span: Good  Recall: Good  Fund of Knowledge: Good  Language: Good   Psychomotor Activity  Psychomotor Activity: Psychomotor Activity: Normal   Assets  Assets: Communication Skills   Sleep  Sleep: Sleep: Good    Physical Exam: Physical Exam Eyes:     Pupils: Pupils are equal, round, and reactive to light.  Cardiovascular:     Rate and Rhythm: Normal rate.  Neurological:     Mental Status: He is alert.  Psychiatric:        Attention and Perception: Attention normal.        Mood and Affect: Mood normal.        Speech: Speech normal.        Behavior: Behavior normal. Behavior is cooperative.        Thought Content: Thought content normal.        Cognition and Memory: Memory normal.        Judgment: Judgment normal.    Review of Systems  Constitutional: Negative.   HENT: Negative.    Respiratory: Negative.    Psychiatric/Behavioral: Negative.     Blood pressure (!) 98/52, pulse 92, temperature 98 F (36.7 C),  temperature source Oral, resp. rate 16, height 5\' 7"  (1.702 m), weight 89.4 kg, SpO2 92 %. Body mass index is 30.85 kg/m.   Medical Decision Making: Patient is psychiatrically cleared. Patient case review and discussed with Dr. Dwyane Dee, and patient does not meet inpatient criteria for inpatient psychiatric treatment. At time of discharge, patient denies SI, HI, AVH and is able to contract for safety. He demonstrated no overt evidence of psychosis or mania. Prior to discharge, he verbalized that they understood warning signs, triggers, and symptoms of worsening mental health and how to access emergency mental health care if they felt it was needed. Patient was instructed to call 911 or return to the emergency room if they experienced any concerning symptoms after discharge. Patient voiced understanding and agreed to the above.  Patient given resources to follow up with behavioral health urgent care for therapy and  medication management.   Disposition: Patient does not meet criteria for psychiatric inpatient admission. Supportive therapy provided about ongoing stressors. Discussed crisis plan, support from social network, calling 911, coming to the Emergency Department, and calling Suicide Hotline.    Donae Kueker MOTLEY-MANGRUM, PMHNP 05/04/2022, 1:39 PM

## 2022-05-04 NOTE — ED Provider Notes (Signed)
Patient has been psychiatrically cleared.  He is denying any SI/HI/AVH.  He has safe discharge to go home with niece.  Will discharge with prescription for remaining Keflex for UTI.   Elgie Congo, MD 05/04/22 [phone]

## 2022-05-04 NOTE — ED Notes (Signed)
Spoke with niece for pending discharge, states that she will come get patient soon.

## 2022-05-04 NOTE — ED Provider Notes (Signed)
Emergency Medicine Observation Re-evaluation Note  Johnny [name] is a 72 y.o. male, seen on rounds today.  Pt initially presented to the ED for complaints of Hypotension, Dysuria, and Suicidal Currently, the patient is sleeping.  Receiving Keflex for UTI.  Physical Exam  BP (!) 98/52 (BP Location: Right Arm)   Pulse 92   Temp 98 F (36.7 C) (Oral)   Resp 16   Ht 5\' 7"  (1.702 m)   Wt 89.4 kg   SpO2 92%   BMI 30.85 kg/m  Physical Exam General: sleeping comfortably Cardiac: regular rate Lungs: breathing comfortably Psych: calm  ED Course / MDM  EKG:EKG Interpretation  Date/Time:  Sunday April 30 2022 21:17:13 EDT Ventricular Rate:  88 PR Interval:  159 QRS Duration: 77 QT Interval:  360 QTC Calculation: 436 R Axis:   35 Text Interpretation: Sinus rhythm No previous ECGs available Confirmed by Gareth Morgan [phone]) on 05/01/2022 2:59:25 PM  I have reviewed the labs performed to date as well as medications administered while in observation.  Recent changes in the last 24 hours include no acute events.  Plan  Current plan is for inpatient psychiatric treatment.    Elgie Congo, MD 05/04/22 [phone]

## 2022-05-04 NOTE — ED Notes (Signed)
Pt voiding blood-tinged urine
